# Patient Record
Sex: Female | Born: 1960 | Race: White | Hispanic: No | Marital: Married | Smoking: Never smoker
Health system: Southern US, Community
[De-identification: ages and names within clinical notes are randomized; demographics above are authoritative.]

## PROBLEM LIST (undated history)

## (undated) DIAGNOSIS — I10 Essential (primary) hypertension: Secondary | ICD-10-CM

## (undated) DIAGNOSIS — M1990 Unspecified osteoarthritis, unspecified site: Secondary | ICD-10-CM

## (undated) DIAGNOSIS — E785 Hyperlipidemia, unspecified: Secondary | ICD-10-CM

## (undated) DIAGNOSIS — H269 Unspecified cataract: Secondary | ICD-10-CM

## (undated) DIAGNOSIS — T7840XA Allergy, unspecified, initial encounter: Secondary | ICD-10-CM

## (undated) HISTORY — PX: WISDOM TOOTH EXTRACTION: SHX21

## (undated) HISTORY — PX: SCAR REVISION: SHX5285

## (undated) HISTORY — DX: Unspecified cataract: H26.9

## (undated) HISTORY — PX: LIPOMA EXCISION: SHX5283

## (undated) HISTORY — DX: Unspecified osteoarthritis, unspecified site: M19.90

## (undated) HISTORY — PX: ABDOMINAL HYSTERECTOMY: SHX81

## (undated) HISTORY — DX: Allergy, unspecified, initial encounter: T78.40XA

## (undated) HISTORY — DX: Essential (primary) hypertension: I10

## (undated) HISTORY — DX: Hyperlipidemia, unspecified: E78.5

## (undated) HISTORY — PX: OTHER SURGICAL HISTORY: SHX169

---

## 2000-03-23 ENCOUNTER — Inpatient Hospital Stay (HOSPITAL_COMMUNITY): Admission: RE | Admit: 2000-03-23 | Discharge: 2000-03-24 | Payer: Self-pay | Admitting: Obstetrics and Gynecology

## 2000-03-23 ENCOUNTER — Encounter (INDEPENDENT_AMBULATORY_CARE_PROVIDER_SITE_OTHER): Payer: Self-pay | Admitting: Specialist

## 2004-05-16 HISTORY — PX: TRIGGER FINGER RELEASE: SHX641

## 2006-05-16 HISTORY — PX: CARPAL TUNNEL RELEASE: SHX101

## 2008-07-31 ENCOUNTER — Ambulatory Visit (HOSPITAL_BASED_OUTPATIENT_CLINIC_OR_DEPARTMENT_OTHER): Admission: RE | Admit: 2008-07-31 | Discharge: 2008-07-31 | Payer: Self-pay | Admitting: Orthopedic Surgery

## 2009-12-09 ENCOUNTER — Encounter (INDEPENDENT_AMBULATORY_CARE_PROVIDER_SITE_OTHER): Payer: Self-pay | Admitting: *Deleted

## 2010-01-06 ENCOUNTER — Encounter (INDEPENDENT_AMBULATORY_CARE_PROVIDER_SITE_OTHER): Payer: Self-pay | Admitting: *Deleted

## 2010-01-11 ENCOUNTER — Ambulatory Visit: Payer: Self-pay | Admitting: Gastroenterology

## 2010-01-29 ENCOUNTER — Ambulatory Visit: Payer: Self-pay | Admitting: Gastroenterology

## 2010-06-15 NOTE — Letter (Signed)
Summary: Previsit letter  [facility]  [address] [place], [place] [address]   Phone: [phone]  Fax: [phone]       12/09/2009 MRN: [id number]  [facility] [address] [place], [place]  [address]  Dear Ms. Derflinger,  Welcome to the Gastroenterology Division at [facility].    You are scheduled to see a nurse for your pre-procedure visit on 01-11-10 at 8:00a.m. on the 3rd floor at [facility], [address]  We ask that you try to arrive at our office 15 minutes prior to your appointment time to allow for check-in.  Your nurse visit will consist of discussing your medical and surgical history, your immediate family medical history, and your medications.    Please bring a complete list of all your medications or, if you prefer, bring the medication bottles and we will list them.  We will need to be aware of both prescribed and over the counter drugs.  We will need to know exact dosage information as well.  If you are on blood thinners (Coumadin, Plavix, Aggrenox, Ticlid, etc.) please call our office today/prior to your appointment, as we need to consult with your physician about holding your medication.   Please be prepared to read and sign documents such as consent forms, a financial agreement, and acknowledgement forms.  If necessary, and with your consent, a friend or relative is welcome to sit-in on the nurse visit with you.  Please bring your insurance card so that we may make a copy of it.  If your insurance requires a referral to see a specialist, please bring your referral form from your primary care physician.  No co-pay is required for this nurse visit.     If you cannot keep your appointment, please call [phone] to cancel or reschedule prior to your appointment date.  This allows [place] the opportunity to schedule an appointment for another patient in need of care.    Thank you for choosing [facility] for your medical  needs.  We appreciate the opportunity to care for you.  Please visit [place] at our website  to learn more about our practice.                     Sincerely.                                                                                                                   The Gastroenterology Division

## 2010-06-15 NOTE — Miscellaneous (Signed)
Summary: [facility] Previsit/prep  Clinical Lists Changes  Medications: Added new medication of MOVIPREP 100 GM  SOLR (PEG-KCL-NACL-NASULF-NA ASC-C) As per prep instructions. - Signed Rx of MOVIPREP 100 GM  SOLR (PEG-KCL-NACL-NASULF-NA ASC-C) As per prep instructions.;  #1 x 0;  Signed;  Entered by: Wyona Almas RN;  Authorized by: Louis Meckel MD;  Method used: Electronically to [facility]  [address] [phone]*, [address] [place], [place], [place]  [address], Ph: [phone] or [phone], Fax: [phone] Observations: Added new observation of NKA: T (01/11/2010 7:52)    Prescriptions: MOVIPREP 100 GM  SOLR (PEG-KCL-NACL-NASULF-NA ASC-C) As per prep instructions.  #1 x 0   Entered by:   Wyona Almas RN   Authorized by:   Louis Meckel MD   Signed by:   Wyona Almas RN on 01/11/2010   Method used:   Electronically to        [facility]  [address] [phone]* (retail)       [address]       [place], [place]  [address]       Ph: [phone] or [phone]       Fax: [phone]   RxID:   [id number]

## 2010-06-15 NOTE — Letter (Signed)
Summary: [facility] Instructions  [facility]  [address] [place], [place] [address]   Phone: [phone]  Fax: [phone]       Veronica Zimmerman    [birth year]    MRN: [id number]        Procedure Day Dorna Bloom:  Farrell Ours  01/29/10     Arrival Time:  10:30AM     Procedure Time:  11:30AM     Location of Procedure:                    _ X_  [facility] (4th Floor)  PREPARATION FOR COLONOSCOPY WITH MOVIPREP   Starting 5 days prior to your procedure 01/24/10 do not eat nuts, seeds, popcorn, corn, beans, peas,  salads, or any raw vegetables.  Do not take any fiber supplements (e.g. Metamucil, Citrucel, and Benefiber).  THE DAY BEFORE YOUR PROCEDURE         DATE: 01/28/10  DAY: THURSDAY  1.  Drink clear liquids the entire day-NO SOLID FOOD  2.  Do not drink anything colored red or purple.  Avoid juices with pulp.  No orange juice.  3.  Drink at least 64 oz. (8 glasses) of fluid/clear liquids during the day to prevent dehydration and help the prep work efficiently.  CLEAR LIQUIDS INCLUDE: Water Jello Ice Popsicles Tea (sugar ok, no milk/cream) Powdered fruit flavored drinks Coffee (sugar ok, no milk/cream) Gatorade Juice: apple, white grape, white cranberry  Lemonade Clear bullion, consomm, broth Carbonated beverages (any kind) Strained chicken noodle soup Hard Candy                             4.  In the morning, mix first dose of MoviPrep solution:    Empty 1 Pouch A and 1 Pouch B into the disposable container    Add lukewarm drinking water to the top line of the container. Mix to dissolve    Refrigerate (mixed solution should be used within 24 hrs)  5.  Begin drinking the prep at 5:00 p.m. The MoviPrep container is divided by 4 marks.   Every 15 minutes drink the solution down to the next mark (approximately 8 oz) until the full liter is complete.   6.  Follow completed prep with 16 oz of clear liquid of your choice (Nothing red or purple).   Continue to drink clear liquids until bedtime.  7.  Before going to bed, mix second dose of MoviPrep solution:    Empty 1 Pouch A and 1 Pouch B into the disposable container    Add lukewarm drinking water to the top line of the container. Mix to dissolve    Refrigerate  THE DAY OF YOUR PROCEDURE      DATE: 01/29/10   DAY: FRIDAY  Beginning at 6:30AM (5 hours before procedure):         1. Every 15 minutes, drink the solution down to the next mark (approx 8 oz) until the full liter is complete.  2. Follow completed prep with 16 oz. of clear liquid of your choice.    3. You may drink clear liquids until 9:30AM (2 HOURS BEFORE PROCEDURE).   MEDICATION INSTRUCTIONS  Unless otherwise instructed, you should take regular prescription medications with a small sip of water   as early as possible the morning of your procedure.         OTHER INSTRUCTIONS  You will need a responsible adult at  least 52 years of age to accompany you and drive you home.   This person must remain in the waiting room during your procedure.  Wear loose fitting clothing that is easily removed.  Leave jewelry and other valuables at home.  However, you may wish to bring a book to read or  an iPod/MP3 player to listen to music as you wait for your procedure to start.  Remove all body piercing jewelry and leave at home.  Total time from sign-in until discharge is approximately 2-3 hours.  You should go home directly after your procedure and rest.  You can resume normal activities the  day after your procedure.  The day of your procedure you should not:   Drive   Make legal decisions   Operate machinery   Drink alcohol   Return to work  You will receive specific instructions about eating, activities and medications before you leave.    The above instructions have been reviewed and explained to me by   Wyona Almas RN  January 11, 2010 8:17 AM     I fully understand and can verbalize these  instructions _____________________________ Date _________

## 2010-06-15 NOTE — Procedures (Signed)
Summary: Colonoscopy  Patient: Veronica Zimmerman Note: All result statuses are Final unless otherwise noted.  Tests: (1) Colonoscopy (COL)   COL Colonoscopy           DONE     [facility]     [address]     [place], [place]  [address]           COLONOSCOPY PROCEDURE REPORT           PATIENT:  Veronica Zimmerman, Veronica Zimmerman  MR#:  [id number]     BIRTHDATE:  08/26/1960, 49 yrs. old  GENDER:  female           ENDOSCOPIST:  Barbette Hair. Arlyce Dice, MD     Referred by:           PROCEDURE DATE:  01/29/2010     PROCEDURE:  Diagnostic Colonoscopy     ASA CLASS:  Class I     INDICATIONS:  1) Routine Risk Screening           MEDICATIONS:   Fentanyl 75 mcg IV, Versed 9 mg IV           DESCRIPTION OF PROCEDURE:   After the risks benefits and     alternatives of the procedure were thoroughly explained, informed     consent was obtained.  Digital rectal exam was performed and     revealed no abnormalities.   The LB CF-[id number] endoscope     was introduced through the anus and advanced to the cecum, which     was identified by both the appendix and ileocecal valve, without     limitations.  The quality of the prep was excellent, using     MoviPrep.  The instrument was then slowly withdrawn as the colon     was fully examined.     <<PROCEDUREIMAGES>>           FINDINGS:  A normal appearing cecum, ileocecal valve, and     appendiceal orifice were identified. The ascending, hepatic     flexure, transverse, splenic flexure, descending, sigmoid colon,     and rectum appeared unremarkable (see image2, image3, image4,     image5, image6, image7, image10, image13, image16, image18, and     image19).   Retroflexed views in the rectum revealed no     abnormalities.    The time to cecum =  4.0  minutes. The scope was     then withdrawn (time =  6.0  min) from the patient and the     procedure completed.           COMPLICATIONS:  None           ENDOSCOPIC IMPRESSION:     1) Normal colon  RECOMMENDATIONS:     1) Continue current colorectal screening recommendations for     "routine risk" patients with a repeat colonoscopy in 10 years.           REPEAT EXAM:  In 10 year(s) for Colonoscopy.           ______________________________     Barbette Hair. Arlyce Dice, MD           CC: Markham Jordan MD           n.     Rosalie DoctorBarbette Hair. Kaplan at 01/29/2010 12:19 PM           Madolyn Frieze, [id number]  Note: An exclamation mark (!) indicates a result that was not dispersed  into the flowsheet. Document Creation Date: 01/29/2010 12:19 PM _______________________________________________________________________  (1) Order result status: Final Collection or observation date-time: 01/29/2010 12:13 Requested date-time:  Receipt date-time:  Reported date-time:  Referring Physician:   Ordering Physician: Melvia Heaps [phone]) Specimen Source:  Source: Launa Grill Order Number: [id number] Lab site:   Appended Document: Colonoscopy    Clinical Lists Changes  Observations: Added new observation of COLONNXTDUE: 01/2020 (01/29/2010 15:07)

## 2010-09-28 NOTE — Op Note (Signed)
Veronica Zimmerman, SHAREEF                ACCOUNT NO.:  [id number]   MEDICAL RECORD NO.:  [id number]          PATIENT TYPE:  AMB   LOCATION:  DSC                          FACILITY:  [facility]   PHYSICIAN:  Rodney A. 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  This has been treated with anti-  inflammatory drugs and injections which only gave her temporary relief.  She now wished to have a surgical release of her thumb.  Questions were  answered and encouraged preoperatively.  Complications were discussed in  the office and in the preop area prior to surgery.   JUSTIFICATION FOR OUTPATIENT SURGERY:  Minimal morbidity.   PREOPERATIVE DIAGNOSIS:  Right trigger thumb.   POSTOPERATIVE DIAGNOSIS:  Right trigger thumb.   OPERATION:  Release of A1 pulley, flexor pollicis longus tendon sheath,  right thumb.   SURGEON:  Lenard Galloway. Chaney Malling, MD   ANESTHESIA:  MAC.   PROCEDURE IN DETAIL:  The patient was placed on the operating table in  supine position with pneumatic tourniquet applied to the right upper  arm.  The entire right upper extremity was prepped with DuraPrep and  draped out in the usual manner.  Local Xylocaine was used to do a  digital nerve block.  The hand was then wrapped out with an Esmarch and  tourniquet was elevated.  Loupe magnification was used throughout.  A  Brunner Z type incision made on the flexor surface of the MP joint to  the right thumb.  Skin edges were retracted.  Bleeders were coagulated.  A digital nerve was identified nicely and small right angle retractors  were put in place to protect the nerves.  The flexor sheath was seen to  be markedly thickened.  There were some cortisone deposits around the  sheath.  The sheath was incised and a portion of sheath was then  excised  to allow full mobilization of the FPL.  The sheath was then split  proximally and distally and very careful examination of the FPL was  done.  There was free range of motion about the FPL.  Under direct  vision, no strictures or other abnormalities.  Skin edges were then  closed with 4-0 nylon sutures.  Sterile dressing was applied and the  patient returned to the recovery room in excellent condition.  Technically, this procedure went extremely well.   DISPOSITION:  1. Norco for pain.  2. Usual postop instructions.  3. Return to my office on Wednesday.  4. She may remove the bandage in 3-4 days and put on a light bandage      and use the thumb as needed.      Rodney A. Chaney Malling, M.D.  Electronically Signed     RAM/MEDQ  D:  07/31/2008  T:  07/31/2008  Job:  [id number]

## 2010-10-01 NOTE — Op Note (Signed)
[facility]  Patient:    Veronica Zimmerman, Veronica Zimmerman [facility]                MRN: [id number] Proc. Date: 03/23/00 Adm. Date:  [id number] Attending:  Lendon Colonel                           Operative Report  PREOPERATIVE DIAGNOSIS:  Persistent menorrhagia and uterine enlargement, status post two cesarean sections.  POSTOPERATIVE DIAGNOSIS:  Persistent menorrhagia and uterine enlargement, status post two cesarean sections.  OPERATION:  Vaginal hysterectomy.  SURGEON:  Katherine Roan, M.D.  DESCRIPTION OF PROCEDURE:  The patient was placed in the lithotomy position, prepped and draped in the usual fashion.  The bladder was emptied.  The cervix was grasped with a tenaculum and injected with 10 cc of 1% Xylocaine with epinephrine.  Following this, the posterior cul-de-sac was incised with sharp dissection.  Uterosacrals and cardinals were then clamped and ligated with 0 chromic suture.  The vesicouterine peritoneum was dissected anteriorly.  The bladder flap was quite high from the previous C-section, so one other clamp on each side of the uterus to obtain uterine artery ligation was accomplished. The vesicouterine peritoneum was then identified and entered with sharp dissection.  The remaining portion of the upper broad ligament was then clamped and ligated with 0 chromic x 2 on each side.  The uterus was fairly enlarged, so we were very careful to obtain small bites after some clamps were utilized.  I then flipped the uterus posteriorly, but it was too big that I could not remove it, so we flipped it anteriorly, and were able to remove the uterus by clamping across utero-ovarian pedicle and the upper broad ligament. Both ovaries were normal.  Hemostasis was secured.  There was a oozer in the pedicle of the right ovary and this was arrested with an 0 Vicryl suture. Uterosacral ligaments were then carefully plicated in the midline with 0 Vicryl, and the vagina  was closed in a vertical plane, first from the uterosacral down inferiorly to using figure-of-eight of 0 Vicryl.  Then, the pelvic peritoneum was pursestringed, and the remaining vagina was closed in a vertical plane with a locking suture of 0 PDS.  Hemostasis was quite secure. The bladder was drained of about 150 cc of clear urine.  The patient tolerated this procedure and was sent to the recovery room in good condition. DD:  03/23/00 TD:  03/23/00 Job: [id number]

## 2010-10-01 NOTE — Discharge Summary (Signed)
[facility]  Patient:    Veronica Zimmerman, Veronica Zimmerman [facility]                MRN: [id number] Adm. Date:  [id number] Disc. Date: [id number] Attending:  Lendon Colonel                           Discharge Summary  ADMITTING DIAGNOSIS:  Continued menorrhagia, uterine enlargement.  DISCHARGE DIAGNOSIS:  Continued menorrhagia, uterine enlargement.  OPERATION PERFORMED:  Vaginal hysterectomy.  HISTORY OF PRESENT ILLNESS:  Ms. Dougher is a 50 year old female status post two C sections who presents for a vaginal hysterectomy for continued heavy bleeding.  She had had conservative attempts with hysteroscopy and laparoscopy to no avail.  Oral contraceptives were not an item for her because she was intolerant.  LABORATORY STUDIES:  Hemoglobin on admission was 12.5 and an MCV was 88. Coagulation profile was normal as was cardiogram.  HOSPITAL COURSE:  Patient was admitted to the hospital and underwent an uneventful vaginal hysterectomy.  The following day, she was discharged to home and office care.  She was asked to call for bleeding, or fever, or any pain that she may have.  Final pathology report revealed the uterus to weight 178 g.  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  CONDITION ON DISCHARGE:  Improved. DD:  04/05/00 TD:  04/07/00 Job: [id number]

## 2010-10-01 NOTE — H&P (Signed)
[facility]  Patient:    Veronica Zimmerman, Veronica Zimmerman                         MRN: [id number] Adm. Date:  03/22/00 Attending:  Katherine Roan, M.D.                         History and Physical  CHIEF COMPLAINT:  Continued heavy periods.  HISTORY OF PRESENT ILLNESS:  Veronica Zimmerman is a 50 year old gravida 2, para 2 female, status post two C-sections in 1984 and 1986 who presents for a hysterectomy for continued heavy bleeding.  She has had conservative attempts with hysteroscopy and laparoscopy to no avail.  She cannot take oral contraceptives because she is intolerant to the oral contraceptives.  PAST SURGICAL PROCEDURES:  Included wisdom teeth in 1984.  She has a scar vision on her face in 1986, laparoscopy, hysteroscopy in 1999, and she is status post C-sections.  REVIEW OF SYSTEMS:  HEENT:  She wears glasses, but no dizziness.  Decreasing visual and auditory acuity, no headaches.  HEART:  No history of rheumatic fever, no history of mitral valve prolapse, no hypertension.  LUNGS:  No chronic cough or asthma.  No hemoptysis.  GENITOURINARY:  No stress urinary incontinence, no frequency, no history of recurrent UTIs.  GASTROINTESTINAL: No bowel habit change, no indigestion, no weight loss or anorexia.  No melena. MUSCLES, BONES, AND JOINTS:  No fractures or arthritis.  SOCIAL HISTORY:  She works at day care.  FAMILY HISTORY:  Mother living at 42, had a history of uterine cancer.  Father died at age 70 from cancer.  She has one brother who has had Hodgkins and is living and well.  She has two sisters who are living and well.  PHYSICAL EXAMINATION:  GENERAL:  Well-developed, well-nourished female, no acute distress.  She appears consistent with stated age.  VITAL SIGNS:  Weight 184 pounds, blood pressure 120/70.  HEENT:  Unremarkable.  Oropharynx is not injected.  NECK:  Supple.  Carotid pulses are without bruits.  Thyroid is not enlarged. Trachea is in the  midline.  BREASTS:  No masses or tenderness.  LUNGS:  Clear to P&A.  Diaphragms move well with inspiration and expiration.  HEART:  Normal sinus rhythm.  No heaves, thrills, rubs, or gallops.  ABDOMEN:  Soft on plain.  No mass is felt.  Bowel sounds are normal.  Liver, spleen, or kidneys are not enlarged.  Femoral pulses are equal bilaterally without bruits.  PELVIC:  Normal vulva and vagina.  The cervix is well supported.  Uterus is normal size and shape, maybe slightly enlarged.  Adnexae negative. Rectovaginal confirms.  EXTREMITIES:  Show good range of motion, equal pulses and reflexes.  IMPRESSION:  Menorrhagia, persistent despite a conservative attempt.  PLAN:  _________ hysterectomy, possible abdominal hysterectomy.  Risks and benefits detailed and informed consent have been given to the patient. DD:  03/22/00 TD:  03/22/00 Job: [id number]

## 2013-05-30 ENCOUNTER — Ambulatory Visit (INDEPENDENT_AMBULATORY_CARE_PROVIDER_SITE_OTHER): Payer: 59

## 2013-05-30 ENCOUNTER — Ambulatory Visit (INDEPENDENT_AMBULATORY_CARE_PROVIDER_SITE_OTHER): Payer: 59 | Admitting: Podiatry

## 2013-05-30 ENCOUNTER — Encounter: Payer: Self-pay | Admitting: Podiatry

## 2013-05-30 VITALS — BP 131/76 | HR 67 | Resp 12

## 2013-05-30 DIAGNOSIS — R52 Pain, unspecified: Secondary | ICD-10-CM

## 2013-05-30 DIAGNOSIS — M629 Disorder of muscle, unspecified: Secondary | ICD-10-CM

## 2013-05-30 DIAGNOSIS — M2420 Disorder of ligament, unspecified site: Secondary | ICD-10-CM

## 2013-05-30 MED ORDER — MELOXICAM 15 MG PO TABS: 15.0000 mg | ORAL_TABLET | Freq: Every day | ORAL | Status: DC

## 2013-06-01 NOTE — Progress Notes (Signed)
She states that she was bowling the other night and heard and felt a tear and her right foot. She states since that point her plantar fasciitis seems to be resolving to some degree.  Objective: Vital signs are stable she is alert and oriented x3. There is no erythema edema cellulitis drainage or odor to the right foot. She says have some tenderness on palpation just distal to the insertion site of the plantar fascia of the right foot. Radiographic evaluation does demonstrate an interruption of the plantar fascial just distal to the insertion site. But no osseous abnormalities.  Assessment: Probable rupture of the plantar fascial. Right foot  Plan: Discussed etiology pathology conservative versus surgical therapies at this point I dispensed a Cam Walker and a night splint encourage should wear these for 4-6 weeks and continue ice therapy followup with her on an as-needed basis

## 2013-06-27 ENCOUNTER — Encounter: Payer: Self-pay | Admitting: Podiatry

## 2013-06-27 ENCOUNTER — Ambulatory Visit (INDEPENDENT_AMBULATORY_CARE_PROVIDER_SITE_OTHER): Payer: 59 | Admitting: Podiatry

## 2013-06-27 VITALS — BP 130/71 | HR 71 | Resp 16 | Ht 63.0 in | Wt 215.0 lb

## 2013-06-27 DIAGNOSIS — M629 Disorder of muscle, unspecified: Secondary | ICD-10-CM

## 2013-06-27 NOTE — Progress Notes (Signed)
She presents today wearing her Cam Walker to her right heel. She states it is still tiny bit of pain from this plantar fascial tear.  Objective: Vital signs are stable she is alert and oriented x3. She has minimal tenderness on palpation with deficit of the plantar fascia just distal to the calcaneal insertion site of the right heel. It appears to be the medial band.  Assessment: Plantar fascial tear right foot.  Plan: Discussed etiology pathology conservative versus surgical therapies. At this point I decided not to inject the area getting a time to heal 100%. I encouraged her to discontinue the use of the Cam Walker her regular basis should she need it on occasion that would be fine however I want her to wear a good structured tennis shoe. She will followup with me in one month at which time if she is no better we will then injected with cortisone.

## 2013-07-25 ENCOUNTER — Ambulatory Visit (INDEPENDENT_AMBULATORY_CARE_PROVIDER_SITE_OTHER): Payer: 59 | Admitting: Podiatry

## 2013-07-25 VITALS — BP 139/73 | HR 78 | Resp 16 | Ht 63.0 in | Wt 225.0 lb

## 2013-07-25 DIAGNOSIS — R52 Pain, unspecified: Secondary | ICD-10-CM

## 2013-07-25 DIAGNOSIS — M722 Plantar fascial fibromatosis: Secondary | ICD-10-CM

## 2013-07-26 NOTE — Progress Notes (Signed)
She presents today complaining of pain to the right heel once again. She continues conservative therapies that time however she takes her nonsteroidal anti-inflammatory drug on an as-needed basis.  Objective: Vital signs are stable she is alert and oriented x3 she has pain on palpation medial continued tubercle of the right heel.  Assessment: Plantar fasciitis right.  Plan injected the right heel today encouraged her to continue the use of the night splint as well as her inserts as well as her oral anti-inflammatories. We'll followup with her in one month if needed.

## 2013-08-08 ENCOUNTER — Ambulatory Visit: Payer: 59 | Admitting: Podiatry

## 2014-02-05 ENCOUNTER — Ambulatory Visit (INDEPENDENT_AMBULATORY_CARE_PROVIDER_SITE_OTHER): Payer: 59 | Admitting: Podiatry

## 2014-02-05 VITALS — BP 130/82 | HR 62 | Resp 16

## 2014-02-05 DIAGNOSIS — L608 Other nail disorders: Secondary | ICD-10-CM

## 2014-02-05 NOTE — Progress Notes (Signed)
She presents today with a chief complaint of discolored toenails one through 5 bilaterally.  Objective: Vital signs are stable she is alert and oriented x3 pulses are palpable bilateral. Nails are thick yellow dystrophic have a greenish gray color change to them.  Assessment: Nail dystrophy 1 through 5 bilateral.  Plan: Nail samples were taken from the hallux nails bilaterally today and sent for pathologic evaluation. We will notify her once the cement.

## 2014-02-17 ENCOUNTER — Encounter: Payer: Self-pay | Admitting: Podiatry

## 2014-02-19 ENCOUNTER — Encounter: Payer: Self-pay | Admitting: Podiatry

## 2014-03-03 ENCOUNTER — Ambulatory Visit (INDEPENDENT_AMBULATORY_CARE_PROVIDER_SITE_OTHER): Payer: 59 | Admitting: Podiatry

## 2014-03-03 VITALS — BP 122/69 | HR 61 | Resp 16

## 2014-03-03 DIAGNOSIS — Z79899 Other long term (current) drug therapy: Secondary | ICD-10-CM

## 2014-03-03 LAB — CBC WITH DIFFERENTIAL/PLATELET
Basophils Absolute: 0.1 10*3/uL (ref 0.0–0.2)
Basos: 1 %
EOS: 3 %
Eosinophils Absolute: 0.2 10*3/uL (ref 0.0–0.4)
HEMATOCRIT: 39.5 % (ref 34.0–46.6)
HEMOGLOBIN: 12.9 g/dL (ref 11.1–15.9)
IMMATURE GRANULOCYTES: 0 %
Immature Grans (Abs): 0 10*3/uL (ref 0.0–0.1)
LYMPHS ABS: 1.8 10*3/uL (ref 0.7–3.1)
Lymphs: 28 %
MCH: 29.9 pg (ref 26.6–33.0)
MCHC: 32.7 g/dL (ref 31.5–35.7)
MCV: 92 fL (ref 79–97)
MONOCYTES: 8 %
Monocytes Absolute: 0.5 10*3/uL (ref 0.1–0.9)
NEUTROS ABS: 3.8 10*3/uL (ref 1.4–7.0)
Neutrophils Relative %: 60 %
RBC: 4.31 x10E6/uL (ref 3.77–5.28)
RDW: 13.3 % (ref 12.3–15.4)
WBC: 6.4 10*3/uL (ref 3.4–10.8)

## 2014-03-03 MED ORDER — TERBINAFINE HCL 250 MG PO TABS: 250.0000 mg | ORAL_TABLET | Freq: Every day | ORAL | Status: DC

## 2014-03-03 NOTE — Progress Notes (Signed)
She presents today for followup of her lab reports.  Objective: Pulses are strongly palpable. Lab reports did demonstrate saprophytic fungus.  Assessment: Onychomycosis.  Plan: Discussed etiology pathology conservative versus surgical therapies. Started her Lamisil therapy. She received a prescription for liver profile and CBC which will be drawn at her earliest convenience. I will followup with her should this return abnormal.

## 2014-03-04 ENCOUNTER — Telehealth: Payer: Self-pay | Admitting: *Deleted

## 2014-03-04 LAB — HEPATIC FUNCTION PANEL
ALT: 21 IU/L (ref 0–32)
AST: 21 IU/L (ref 0–40)
Albumin: 4.5 g/dL (ref 3.5–5.5)
Alkaline Phosphatase: 87 IU/L (ref 39–117)
Bilirubin, Direct: 0.05 mg/dL (ref 0.00–0.40)
Total Bilirubin: 0.3 mg/dL (ref 0.0–1.2)
Total Protein: 6.9 g/dL (ref 6.0–8.5)

## 2014-03-04 NOTE — Telephone Encounter (Signed)
Pt called stated she took lamisil last night with a graham cracker and she went to bed and her chest felt like it was fluttering. Wants to know if she needs to stop taking lamisil and if this is a serious side effect?

## 2014-03-04 NOTE — Telephone Encounter (Signed)
Tell her to try to take it with dinner.

## 2014-03-05 ENCOUNTER — Telehealth: Payer: Self-pay | Admitting: *Deleted

## 2014-03-05 NOTE — Telephone Encounter (Signed)
Spoke with pt letting her know per dr Al Corpushyatt try to take lamisil with dinner. Pt stated she took a pill last night and did not have any fluttering.

## 2014-03-05 NOTE — Telephone Encounter (Signed)
Spoke to patient regarding her blood work ok to continue medication

## 2014-03-05 NOTE — Telephone Encounter (Signed)
Message copied by Bing ReeGUNN, Jobany Montellano L on Wed Mar 05, 2014  2:20 PM ------      Message from: Ernestene KielHYATT, MAX T      Created: Tue Mar 04, 2014  7:18 AM       Blood work looks good.  Continue medication. ------

## 2014-03-11 ENCOUNTER — Telehealth: Payer: Self-pay | Admitting: *Deleted

## 2014-03-11 NOTE — Telephone Encounter (Signed)
Patient states that since she started lamisil she has had some aches and pain in her back from middle of shoulders down her spine into her legs,  Can this be caused by the lamisil?

## 2014-03-11 NOTE — Telephone Encounter (Signed)
Lamisil is not known to cause muscle aches and pains.  I would continue medication.

## 2014-03-31 ENCOUNTER — Ambulatory Visit (INDEPENDENT_AMBULATORY_CARE_PROVIDER_SITE_OTHER): Payer: 59 | Admitting: Podiatry

## 2014-03-31 VITALS — BP 125/65 | HR 57 | Resp 16

## 2014-03-31 DIAGNOSIS — Z79899 Other long term (current) drug therapy: Secondary | ICD-10-CM

## 2014-03-31 LAB — CBC WITH DIFFERENTIAL/PLATELET
BASOS: 1 %
Basophils Absolute: 0.1 10*3/uL (ref 0.0–0.2)
Eos: 2 %
Eosinophils Absolute: 0.2 10*3/uL (ref 0.0–0.4)
HCT: 40.5 % (ref 34.0–46.6)
Hemoglobin: 13.3 g/dL (ref 11.1–15.9)
IMMATURE GRANS (ABS): 0 10*3/uL (ref 0.0–0.1)
Immature Granulocytes: 0 %
LYMPHS: 21 %
Lymphocytes Absolute: 1.7 10*3/uL (ref 0.7–3.1)
MCH: 30 pg (ref 26.6–33.0)
MCHC: 32.8 g/dL (ref 31.5–35.7)
MCV: 91 fL (ref 79–97)
Monocytes Absolute: 0.6 10*3/uL (ref 0.1–0.9)
Monocytes: 7 %
Neutrophils Absolute: 5.6 10*3/uL (ref 1.4–7.0)
Neutrophils Relative %: 69 %
RBC: 4.44 x10E6/uL (ref 3.77–5.28)
RDW: 13.2 % (ref 12.3–15.4)
WBC: 8.2 10*3/uL (ref 3.4–10.8)

## 2014-03-31 MED ORDER — TERBINAFINE HCL 250 MG PO TABS: 250.0000 mg | ORAL_TABLET | Freq: Every day | ORAL | Status: DC

## 2014-03-31 NOTE — Progress Notes (Signed)
She presents today denying complications with the use of Lamisil.  Objective: Nails appear to be more clear and the fungus appears to be growing out.  Assessment: Onychomycosis long-term therapy with use of Lamisil.  Plan: Continue use of Lamisil for another 90 days when prescription for CBC and liver profile. I will follow-up with her should this come back abnormal. Otherwise I will see her in 4 months area

## 2014-04-01 LAB — HEPATIC FUNCTION PANEL
ALT: 25 IU/L (ref 0–32)
AST: 23 IU/L (ref 0–40)
Albumin: 4.8 g/dL (ref 3.5–5.5)
Alkaline Phosphatase: 105 IU/L (ref 39–117)
BILIRUBIN DIRECT: 0.06 mg/dL (ref 0.00–0.40)
TOTAL PROTEIN: 7.2 g/dL (ref 6.0–8.5)
Total Bilirubin: 0.2 mg/dL (ref 0.0–1.2)

## 2014-04-02 ENCOUNTER — Telehealth: Payer: Self-pay | Admitting: *Deleted

## 2014-04-02 NOTE — Telephone Encounter (Signed)
-----   Message from Elinor ParkinsonMax T Hyatt, [place] Dakota[place] sent at 04/01/2014  7:50 AM EST ----- Blood work looks good and may continue with current medication.

## 2014-04-02 NOTE — Telephone Encounter (Signed)
Blood work ok continue with medication . Spoke to patient

## 2014-05-06 ENCOUNTER — Other Ambulatory Visit: Payer: Self-pay | Admitting: *Deleted

## 2014-05-06 MED ORDER — MELOXICAM 15 MG PO TABS: 15.0000 mg | ORAL_TABLET | Freq: Every day | ORAL | Status: DC

## 2014-05-06 NOTE — Telephone Encounter (Signed)
Fax request from [facility] [place] for mobic 15 mg. Per dr Al Corpushyatt refill mobic 15 mg #30 with 3 refills. One by mouth daily.

## 2014-05-07 ENCOUNTER — Other Ambulatory Visit: Payer: Self-pay | Admitting: *Deleted

## 2014-05-07 MED ORDER — MELOXICAM 15 MG PO TABS: 15.0000 mg | ORAL_TABLET | Freq: Every day | ORAL | Status: DC

## 2014-07-30 ENCOUNTER — Ambulatory Visit (INDEPENDENT_AMBULATORY_CARE_PROVIDER_SITE_OTHER): Payer: Commercial Managed Care - PPO | Admitting: Podiatry

## 2014-07-30 VITALS — BP 122/68 | HR 61 | Resp 16

## 2014-07-30 DIAGNOSIS — Z79899 Other long term (current) drug therapy: Secondary | ICD-10-CM

## 2014-07-30 NOTE — Progress Notes (Signed)
She presents today for follow-up of onychomycosis of the hallux nails bilateral. She states that they do not seem to be getting better.  Objective: Vital signs are stable she is alert and oriented 3. She has completed 120 days of Lamisil therapy. At this point her nails are unchanged hallux bilateral.  Assessment: Mycosis hallux bilateral.  Plan: Suggested we start laser therapy to nail plates hallux bilateral.

## 2014-08-05 ENCOUNTER — Ambulatory Visit: Payer: Commercial Managed Care - PPO | Admitting: Podiatry

## 2014-08-05 ENCOUNTER — Ambulatory Visit: Payer: Commercial Managed Care - PPO

## 2014-08-05 ENCOUNTER — Ambulatory Visit (INDEPENDENT_AMBULATORY_CARE_PROVIDER_SITE_OTHER): Payer: Commercial Managed Care - PPO | Admitting: Podiatry

## 2014-08-05 DIAGNOSIS — B351 Tinea unguium: Secondary | ICD-10-CM

## 2014-08-05 NOTE — Progress Notes (Signed)
She presented today for laser therapy of onychomycosis. She tolerated the procedure well and will follow up with us in 3 months for another laser therapy.

## 2014-11-11 ENCOUNTER — Ambulatory Visit (INDEPENDENT_AMBULATORY_CARE_PROVIDER_SITE_OTHER): Payer: Commercial Managed Care - PPO | Admitting: Podiatry

## 2014-11-11 ENCOUNTER — Encounter: Payer: Self-pay | Admitting: Podiatry

## 2014-11-11 DIAGNOSIS — B351 Tinea unguium: Secondary | ICD-10-CM

## 2014-11-11 NOTE — Progress Notes (Signed)
She presented today for laser therapy for onychomycosis to hallux bilateral. She tolerated the procedure well and will follow up with us in 3 months for another laser therapy if necessary.

## 2015-02-12 ENCOUNTER — Encounter: Payer: Self-pay | Admitting: Podiatry

## 2015-02-12 ENCOUNTER — Ambulatory Visit: Payer: Commercial Managed Care - PPO | Admitting: Podiatry

## 2015-02-12 DIAGNOSIS — B351 Tinea unguium: Secondary | ICD-10-CM

## 2015-02-13 NOTE — Progress Notes (Signed)
She presents today for follow-up of her onychomycosis and laser therapy. She states that after the second treatment she noticed that it was seen to be doing much better.  Objective: Hallux nail plates bilaterally demonstrating clearing of the onychomycosis.  Assessment: Onychomycosis bilateral.  Plan: Third laser therapy hallux bilateral. Follow-up with me in 2-3 months. Consider another laser therapy at that time.

## 2015-04-14 ENCOUNTER — Ambulatory Visit (INDEPENDENT_AMBULATORY_CARE_PROVIDER_SITE_OTHER): Payer: 59 | Admitting: Podiatry

## 2015-04-14 ENCOUNTER — Encounter: Payer: Self-pay | Admitting: Podiatry

## 2015-04-14 DIAGNOSIS — B351 Tinea unguium: Secondary | ICD-10-CM

## 2015-04-14 NOTE — Progress Notes (Signed)
She presents today to follow-up for onychomycosis after laser nail therapy. She states that it really seems to not have worked very well for these nails. She states that initially it seemed to be doing great and then it subsided and worsened.  Objective: Vital signs are stable she is alert and oriented 3 pulses are palpable. Hallux nail plates bilateral do demonstrate onychocryptosis with nail dystrophy and worsening of discoloration which very well could be worsening of her onychomycosis.  Assessment: Intractable onychomycosis halluxbilateral.  Plan: I encouraged her to keep the nails cut short and filed thin as possible to help prevent them from rubbing which would worsen the nail dystrophy. Follow-up with me as needed.  Arbutus Pedodd Camy Leder DPM

## 2015-06-22 ENCOUNTER — Encounter: Payer: Self-pay | Admitting: Gastroenterology

## 2016-02-25 ENCOUNTER — Ambulatory Visit (INDEPENDENT_AMBULATORY_CARE_PROVIDER_SITE_OTHER): Payer: 59 | Admitting: Podiatry

## 2016-02-25 ENCOUNTER — Encounter: Payer: Self-pay | Admitting: Podiatry

## 2016-02-25 ENCOUNTER — Ambulatory Visit (INDEPENDENT_AMBULATORY_CARE_PROVIDER_SITE_OTHER): Payer: 59

## 2016-02-25 DIAGNOSIS — M79676 Pain in unspecified toe(s): Secondary | ICD-10-CM

## 2016-02-25 DIAGNOSIS — M722 Plantar fascial fibromatosis: Secondary | ICD-10-CM

## 2016-02-25 DIAGNOSIS — B351 Tinea unguium: Secondary | ICD-10-CM | POA: Diagnosis not present

## 2016-02-25 MED ORDER — DICLOFENAC SODIUM 75 MG PO TBEC: 75.0000 mg | DELAYED_RELEASE_TABLET | Freq: Two times a day (BID) | ORAL | 3 refills | Status: DC

## 2016-02-25 NOTE — Progress Notes (Signed)
She presents today with chief complaint of pain to the left heel times a past 6 weeks. Mornings are particularly bad. She states that she took the meloxicam until she ran out which rated fill tolerable.  Objective: Vital signs are stable alert and oriented 3. Pulses are palpable. Neurologic sensorium is intact. Deep tendon reflexes are intact. Muscle strength is 5 over 5 dorsiflexion plantar flexors and inverters and everters all just musculatures intact. Orthopedic evaluation was resulting assistant ankle full range of motion without crepitation. She has pain on palpation make a conjunctival of the left heel. Radiographs do demonstrate soft tissue increased density at the plantar fascia insertion site consisting of plantar fasciitis. No open lesions or wounds are noted.  Assessment: Plantar fasciitis left.  Plan: Injected the left heel today with Kenalog and local anesthetic Western plantar fascia brace and a night splint. Started her on a diclofenac 75 mg. Follow up with her in one month discussed appropriate shoe gear stretching exercises ice therapy and shoe modifications.

## 2016-02-25 NOTE — Patient Instructions (Signed)

## 2016-02-29 ENCOUNTER — Ambulatory Visit: Payer: 59 | Admitting: Podiatry

## 2016-03-03 ENCOUNTER — Telehealth: Payer: Self-pay | Admitting: *Deleted

## 2016-03-03 NOTE — Telephone Encounter (Signed)
Patient called states she was having GI upset from Diclofenac. Requesting meloxicam Rx since she has had that one before. Sent in Meloxicam Rx to [facility] [place]

## 2016-03-07 ENCOUNTER — Other Ambulatory Visit: Payer: Self-pay | Admitting: Podiatry

## 2016-03-07 ENCOUNTER — Telehealth: Payer: Self-pay | Admitting: Podiatry

## 2016-03-07 NOTE — Telephone Encounter (Signed)
PATIENT CALLED LAST Thursday ABOUT GETTING A NEW RX FOR MELOXICAM. PATIENT SAW DR. HYATT 2 WKS AGO AND SHE ADVISED THAT THE DICLOFENAC UPSET HER STOMACH AND SHE WAS NOT ABLE TO TAKE IT ASKED THAT WE CALL SOMETHING ELSE IN FOR HER AND HER PHARMACY [facility] [place] SAID THAT THEY HAVE NOT RECEIVED ANY NEW ORDERS FOR HER FROM DR. HYATT.

## 2016-03-08 MED ORDER — MELOXICAM 15 MG PO TABS: 15.0000 mg | ORAL_TABLET | Freq: Every day | ORAL | 0 refills | Status: DC

## 2016-03-08 NOTE — Addendum Note (Signed)
Addended by: Alphia Kava'CONNELL, VALERY D on: 03/08/2016 10:13 AM   Modules accepted: Orders

## 2016-03-08 NOTE — Telephone Encounter (Signed)
I reviewed pt's [place] Review and 03/03/2016 Mobic had been ordered but not escribed [id number]. I escribed to [id number], and informed pt of the order and apologized for the delay.

## 2016-03-28 ENCOUNTER — Encounter: Payer: Self-pay | Admitting: Podiatry

## 2016-03-28 ENCOUNTER — Ambulatory Visit (INDEPENDENT_AMBULATORY_CARE_PROVIDER_SITE_OTHER): Payer: 59 | Admitting: Podiatry

## 2016-03-28 DIAGNOSIS — M722 Plantar fascial fibromatosis: Secondary | ICD-10-CM

## 2016-03-29 NOTE — Progress Notes (Signed)
She presents today for follow-up of her plantar fasciitis and states that she has good days and bad days.  Objective: Pulses remain palpable pain mild pain on palpation medial calcaneal tubercle slowly resolving.  Assessment: Resolving plantar fasciitis left.  Plan: Reinjected the area today.

## 2016-04-04 ENCOUNTER — Other Ambulatory Visit: Payer: Self-pay | Admitting: Podiatry

## 2016-04-04 NOTE — Telephone Encounter (Signed)
Pt needs an appt prior to future refills. 

## 2016-05-02 ENCOUNTER — Ambulatory Visit (INDEPENDENT_AMBULATORY_CARE_PROVIDER_SITE_OTHER): Payer: 59 | Admitting: Podiatry

## 2016-05-02 DIAGNOSIS — B351 Tinea unguium: Secondary | ICD-10-CM | POA: Diagnosis not present

## 2016-05-02 DIAGNOSIS — M722 Plantar fascial fibromatosis: Secondary | ICD-10-CM | POA: Diagnosis not present

## 2016-05-02 NOTE — Progress Notes (Signed)
She presents today for follow-up of her plantar fasciitis for left foot states that her left foot was doing much better until December 2 she was trying to get a dog into the car and she started to feel pain as she was up on her forefoot. But it has resolved some degree she has not taken any medication for.  Objective: Pulses remain palpable left pain. She has pain on direct palpation of the left heel.  Assessment: Chronic intractable plantar fasciitis left foot.  Plan: I injected Kenalog and local anesthetic to the point of maximal tenderness today will follow up with her in 1 month if necessary.

## 2016-07-13 DIAGNOSIS — Z1231 Encounter for screening mammogram for malignant neoplasm of breast: Secondary | ICD-10-CM | POA: Diagnosis not present

## 2016-07-13 DIAGNOSIS — Z01419 Encounter for gynecological examination (general) (routine) without abnormal findings: Secondary | ICD-10-CM | POA: Diagnosis not present

## 2016-07-27 DIAGNOSIS — G5711 Meralgia paresthetica, right lower limb: Secondary | ICD-10-CM | POA: Diagnosis not present

## 2016-10-17 ENCOUNTER — Encounter (HOSPITAL_COMMUNITY): Payer: Self-pay | Admitting: Emergency Medicine

## 2016-10-17 ENCOUNTER — Emergency Department (HOSPITAL_COMMUNITY): Payer: 59

## 2016-10-17 ENCOUNTER — Emergency Department (HOSPITAL_COMMUNITY)
Admission: EM | Admit: 2016-10-17 | Discharge: 2016-10-17 | Disposition: A | Discharge status: Home / Self Care | Payer: 59 | Attending: Emergency Medicine | Admitting: Emergency Medicine

## 2016-10-17 DIAGNOSIS — Z79899 Other long term (current) drug therapy: Secondary | ICD-10-CM | POA: Diagnosis not present

## 2016-10-17 DIAGNOSIS — R042 Hemoptysis: Secondary | ICD-10-CM | POA: Insufficient documentation

## 2016-10-17 DIAGNOSIS — K920 Hematemesis: Secondary | ICD-10-CM | POA: Diagnosis present

## 2016-10-17 MED ORDER — IOPAMIDOL (ISOVUE-370) INJECTION 76%
INTRAVENOUS | Status: AC
  Administered 2016-10-17: 80 mL
  Filled 2016-10-17: qty 100

## 2016-10-17 NOTE — ED Provider Notes (Signed)
[facility]ENCY DEPT Provider Note   CSN: [id number] Arrival date & time: 10/17/16  1111     History   Chief Complaint Chief Complaint  Patient presents with  . Hematemesis    HPI Bobbiejo Jennings BooksMcMillan Limbach is a 56 y.o. female.Patient opened a can of chlorine for a swimming pool while outdoors this morning and breathed in fumes from the can of chlorine. She began to cough and coughed up blood as result of the event. Since the event she feels as if she "can't get a good deep breath." She denies vomiting or hematemesis. Otherwise asymptomatic without treatment.  HPI  Past Medical History:  Diagnosis Date  . Allergy     There are no active problems to display for this patient.   Past Surgical History:  Procedure Laterality Date  . ABDOMINAL HYSTERECTOMY      OB History    No data available       Home Medications    Prior to Admission medications   Medication Sig Start Date End Date Taking? Authorizing Provider  fluticasone (FLONASE) 50 MCG/ACT nasal spray Place 2 sprays into both nostrils at bedtime. 09/29/16  Yes [provider]  meloxicam (MOBIC) 15 MG tablet TAKE 1 TABLET (15 MG TOTAL) BY MOUTH DAILY. 04/04/16  Yes Hyatt, Max T, DPM  diclofenac (VOLTAREN) 75 MG EC tablet Take 1 tablet (75 mg total) by mouth 2 (two) times daily. Patient not taking: Reported on 10/17/2016 02/25/16   Elinor ParkinsonHyatt, Max T, DPM    Family History No family history on file.  Social History Social History  Substance Use Topics  . Smoking status: Never Smoker  . Smokeless tobacco: Not on file  . Alcohol use No     Allergies   Patient has no known allergies.   Review of Systems Review of Systems  Constitutional: Negative.   HENT: Negative.   Respiratory: Positive for shortness of breath.        Hemoptysis Can't get a good deep breath  Cardiovascular: Negative.   Gastrointestinal: Negative.   Musculoskeletal: Negative.   Skin: Negative.   Neurological: Negative.     Psychiatric/Behavioral: Negative.   All other systems reviewed and are negative.    Physical Exam Updated Vital Signs BP 133/79   Pulse 64   Temp 97.6 F (36.4 C) (Oral)   Resp 14   SpO2 99%   Physical Exam  Constitutional: She appears well-developed and well-nourished.  HENT:  Head: Normocephalic and atraumatic.  Eyes: Conjunctivae are normal. Pupils are equal, round, and reactive to light.  Neck: Neck supple. No tracheal deviation present. No thyromegaly present.  Cardiovascular: Normal rate and regular rhythm.   No murmur heard. Pulmonary/Chest: Effort normal and breath sounds normal.  Abdominal: Soft. Bowel sounds are normal. She exhibits no distension. There is no tenderness.  Obese  Musculoskeletal: Normal range of motion. She exhibits no edema or tenderness.  Neurological: She is alert. Coordination normal.  Skin: Skin is warm and dry. No rash noted.  Psychiatric: She has a normal mood and affect.  Nursing note and vitals reviewed.    ED Treatments / Results  Labs (all labs ordered are listed, but only abnormal results are displayed) Labs Reviewed - No data to display  EKG  EKG Interpretation None       Radiology Dg Chest 2 View  Result Date: 10/17/2016 CLINICAL DATA:  Hemoptysis EXAM: CHEST  2 VIEW COMPARISON:  None. FINDINGS: Lungs are clear. Heart is upper normal in size with pulmonary  vascularity within normal limits. No adenopathy. No bone lesions. IMPRESSION: No edema or consolidation. Electronically Signed   By: Bretta Bang III M.D.   On: 10/17/2016 11:59  Chest x-ray viewed by me  Procedures Procedures (including critical care time)  Medications Ordered in ED Medications - No data to display  Chest x-ray viewed by me Results for orders placed or performed in visit on 03/31/14  CBC with Differential  Result Value Ref Range   WBC 8.2 3.4 - 10.8 x10E3/uL   RBC 4.44 3.77 - 5.28 x10E6/uL   Hemoglobin 13.3 11.1 - 15.9 g/dL   HCT 16.1  09.6 - 04.5 %   MCV 91 79 - 97 fL   MCH 30.0 26.6 - 33.0 pg   MCHC 32.8 31.5 - 35.7 g/dL   RDW 40.9 81.1 - 91.4 %   Neutrophils Relative % 69 %   Lymphs 21 %   Monocytes 7 %   Eos 2 %   Basos 1 %   Neutrophils Absolute 5.6 1.4 - 7.0 x10E3/uL   Lymphocytes Absolute 1.7 0.7 - 3.1 x10E3/uL   Monocytes Absolute 0.6 0.1 - 0.9 x10E3/uL   Eosinophils Absolute 0.2 0.0 - 0.4 x10E3/uL   Basophils Absolute 0.1 0.0 - 0.2 x10E3/uL   Immature Granulocytes 0 %   Immature Grans (Abs) 0.0 0.0 - 0.1 x10E3/uL  Hepatic Function Panel  Result Value Ref Range   Total Protein 7.2 6.0 - 8.5 g/dL   Albumin 4.8 3.5 - 5.5 g/dL   Total Bilirubin 0.2 0.0 - 1.2 mg/dL   Bilirubin, Direct 7.82 0.00 - 0.40 mg/dL   Alkaline Phosphatase 105 39 - 117 IU/L   AST 23 0 - 40 IU/L   ALT 25 0 - 32 IU/L   Dg Chest 2 View  Result Date: 10/17/2016 CLINICAL DATA:  Hemoptysis EXAM: CHEST  2 VIEW COMPARISON:  None. FINDINGS: Lungs are clear. Heart is upper normal in size with pulmonary vascularity within normal limits. No adenopathy. No bone lesions. IMPRESSION: No edema or consolidation. Electronically Signed   By: Bretta Bang III M.D.   On: 10/17/2016 11:59   Ct Angio Chest Pe W Or Wo Contrast  Result Date: 10/17/2016 CLINICAL DATA:  Chlorine inhalation yesterday.  Hemoptysis today. EXAM: CT ANGIOGRAPHY CHEST WITH CONTRAST TECHNIQUE: Multidetector CT imaging of the chest was performed using the standard protocol during bolus administration of intravenous contrast. Multiplanar CT image reconstructions and MIPs were obtained to evaluate the vascular anatomy. CONTRAST:  80 cc Isovue 370 COMPARISON:  Chest radiography same day FINDINGS: Cardiovascular: Pulmonary arterial opacification is good. No pulmonary emboli. No systemic arterial abnormality is seen. No evidence of coronary or aortic calcification. No pericardial fluid. Mediastinum/Nodes: Normal Lungs/Pleura: Lungs do not show any acute process. There are a few benign  subpleural nodules in both lower lungs. No nodule that requires follow-up. No collapse or effusion. Upper Abdomen: Normal Musculoskeletal: Ordinary spinal degenerative changes. Review of the MIP images confirms the above findings. IMPRESSION: No acute finding by CT. No pulmonary emboli. No pulmonary parenchymal pathology. Few small benign subpleural nodules that do not require further follow-up. Electronically Signed   By: Paulina Fusi M.D.   On: 10/17/2016 14:30   Initial Impression / Assessment and Plan / ED Course  I have reviewed the triage vital signs and the nursing notes.  Pertinent labs & imaging results that were available during my care of the patient were reviewed by me and considered in my medical decision making (see chart for  details).     3:10 PM patient resting comfortably. Asymptomatic Hemoptysis and symptoms likely due to chemical irritant of respiratory tree. Plan return if symptoms worsen or follow-up with PMD  Final Clinical Impressions(s) / ED Diagnoses  Diagnosis hemoptysis Final diagnoses:  Hemoptysis    New Prescriptions New Prescriptions   No medications on file     Doug Sou, MD 10/17/16 1520

## 2016-10-17 NOTE — Discharge Instructions (Signed)
CT scan and chest x-ray today showed no cause for concern. Rest in the air conditioning today. And stay away from chemicals such as chlorine that may cause an irritation to your lungs. Return if concern for any reason or call your primary care physician

## 2016-10-17 NOTE — ED Notes (Signed)
Patient transported to CT 

## 2016-10-17 NOTE — ED Notes (Signed)
Pt in no acute distress at NF.

## 2016-10-17 NOTE — ED Triage Notes (Signed)
Pt reports coughing up blood this am after opening up some chlorine tablets yesterday. Denies chest pain or sob, ambulatory to triage, speaking in full sentences.

## 2017-01-20 DIAGNOSIS — H35362 Drusen (degenerative) of macula, left eye: Secondary | ICD-10-CM | POA: Diagnosis not present

## 2017-01-27 DIAGNOSIS — Z23 Encounter for immunization: Secondary | ICD-10-CM | POA: Diagnosis not present

## 2017-01-27 DIAGNOSIS — Z136 Encounter for screening for cardiovascular disorders: Secondary | ICD-10-CM | POA: Diagnosis not present

## 2017-01-27 DIAGNOSIS — Z0000 Encounter for general adult medical examination without abnormal findings: Secondary | ICD-10-CM | POA: Diagnosis not present

## 2017-01-30 DIAGNOSIS — Z780 Asymptomatic menopausal state: Secondary | ICD-10-CM | POA: Diagnosis not present

## 2017-03-11 DIAGNOSIS — J0100 Acute maxillary sinusitis, unspecified: Secondary | ICD-10-CM | POA: Diagnosis not present

## 2017-04-16 DIAGNOSIS — M791 Myalgia, unspecified site: Secondary | ICD-10-CM | POA: Diagnosis not present

## 2017-04-16 DIAGNOSIS — R509 Fever, unspecified: Secondary | ICD-10-CM | POA: Diagnosis not present

## 2017-04-17 DIAGNOSIS — R509 Fever, unspecified: Secondary | ICD-10-CM | POA: Diagnosis not present

## 2017-04-17 DIAGNOSIS — R51 Headache: Secondary | ICD-10-CM | POA: Diagnosis not present

## 2017-04-20 DIAGNOSIS — I83813 Varicose veins of bilateral lower extremities with pain: Secondary | ICD-10-CM | POA: Diagnosis not present

## 2017-05-05 DIAGNOSIS — B078 Other viral warts: Secondary | ICD-10-CM | POA: Diagnosis not present

## 2017-05-11 DIAGNOSIS — I83813 Varicose veins of bilateral lower extremities with pain: Secondary | ICD-10-CM | POA: Diagnosis not present

## 2017-06-12 DIAGNOSIS — I83813 Varicose veins of bilateral lower extremities with pain: Secondary | ICD-10-CM | POA: Diagnosis not present

## 2017-08-04 DIAGNOSIS — M19021 Primary osteoarthritis, right elbow: Secondary | ICD-10-CM | POA: Diagnosis not present

## 2017-08-15 DIAGNOSIS — M25521 Pain in right elbow: Secondary | ICD-10-CM | POA: Diagnosis not present

## 2017-08-26 DIAGNOSIS — M25521 Pain in right elbow: Secondary | ICD-10-CM | POA: Diagnosis not present

## 2017-08-29 DIAGNOSIS — M25521 Pain in right elbow: Secondary | ICD-10-CM | POA: Diagnosis not present

## 2017-09-05 DIAGNOSIS — M24021 Loose body in right elbow: Secondary | ICD-10-CM | POA: Diagnosis not present

## 2017-09-05 DIAGNOSIS — M25521 Pain in right elbow: Secondary | ICD-10-CM | POA: Diagnosis not present

## 2017-10-14 DIAGNOSIS — J209 Acute bronchitis, unspecified: Secondary | ICD-10-CM | POA: Diagnosis not present

## 2017-11-03 DIAGNOSIS — J209 Acute bronchitis, unspecified: Secondary | ICD-10-CM | POA: Diagnosis not present

## 2017-11-10 DIAGNOSIS — M24821 Other specific joint derangements of right elbow, not elsewhere classified: Secondary | ICD-10-CM | POA: Diagnosis not present

## 2017-11-10 DIAGNOSIS — M659 Synovitis and tenosynovitis, unspecified: Secondary | ICD-10-CM | POA: Diagnosis not present

## 2017-11-10 DIAGNOSIS — M24521 Contracture, right elbow: Secondary | ICD-10-CM | POA: Diagnosis not present

## 2017-11-10 DIAGNOSIS — M25521 Pain in right elbow: Secondary | ICD-10-CM | POA: Diagnosis not present

## 2017-11-10 DIAGNOSIS — M19021 Primary osteoarthritis, right elbow: Secondary | ICD-10-CM | POA: Diagnosis not present

## 2017-11-20 DIAGNOSIS — M25521 Pain in right elbow: Secondary | ICD-10-CM | POA: Diagnosis not present

## 2017-11-23 DIAGNOSIS — M19029 Primary osteoarthritis, unspecified elbow: Secondary | ICD-10-CM | POA: Diagnosis not present

## 2017-11-28 DIAGNOSIS — M25629 Stiffness of unspecified elbow, not elsewhere classified: Secondary | ICD-10-CM | POA: Diagnosis not present

## 2017-12-01 DIAGNOSIS — M25629 Stiffness of unspecified elbow, not elsewhere classified: Secondary | ICD-10-CM | POA: Diagnosis not present

## 2017-12-05 DIAGNOSIS — M25621 Stiffness of right elbow, not elsewhere classified: Secondary | ICD-10-CM | POA: Diagnosis not present

## 2017-12-08 DIAGNOSIS — M25621 Stiffness of right elbow, not elsewhere classified: Secondary | ICD-10-CM | POA: Diagnosis not present

## 2017-12-12 DIAGNOSIS — M25621 Stiffness of right elbow, not elsewhere classified: Secondary | ICD-10-CM | POA: Diagnosis not present

## 2017-12-15 DIAGNOSIS — M25629 Stiffness of unspecified elbow, not elsewhere classified: Secondary | ICD-10-CM | POA: Diagnosis not present

## 2017-12-22 DIAGNOSIS — M25621 Stiffness of right elbow, not elsewhere classified: Secondary | ICD-10-CM | POA: Diagnosis not present

## 2018-02-02 DIAGNOSIS — H35362 Drusen (degenerative) of macula, left eye: Secondary | ICD-10-CM | POA: Diagnosis not present

## 2018-03-23 DIAGNOSIS — Z23 Encounter for immunization: Secondary | ICD-10-CM | POA: Diagnosis not present

## 2018-03-23 DIAGNOSIS — Z136 Encounter for screening for cardiovascular disorders: Secondary | ICD-10-CM | POA: Diagnosis not present

## 2018-03-23 DIAGNOSIS — Z0000 Encounter for general adult medical examination without abnormal findings: Secondary | ICD-10-CM | POA: Diagnosis not present

## 2018-06-21 DIAGNOSIS — J00 Acute nasopharyngitis [common cold]: Secondary | ICD-10-CM | POA: Diagnosis not present

## 2018-07-03 IMAGING — DX DG CHEST 2V
2 series · 2 of 2 positions shown · non-contrast
Comparison: None.

CLINICAL DATA: Hemoptysis

EXAM:
CHEST  2 VIEW

[chest pa]
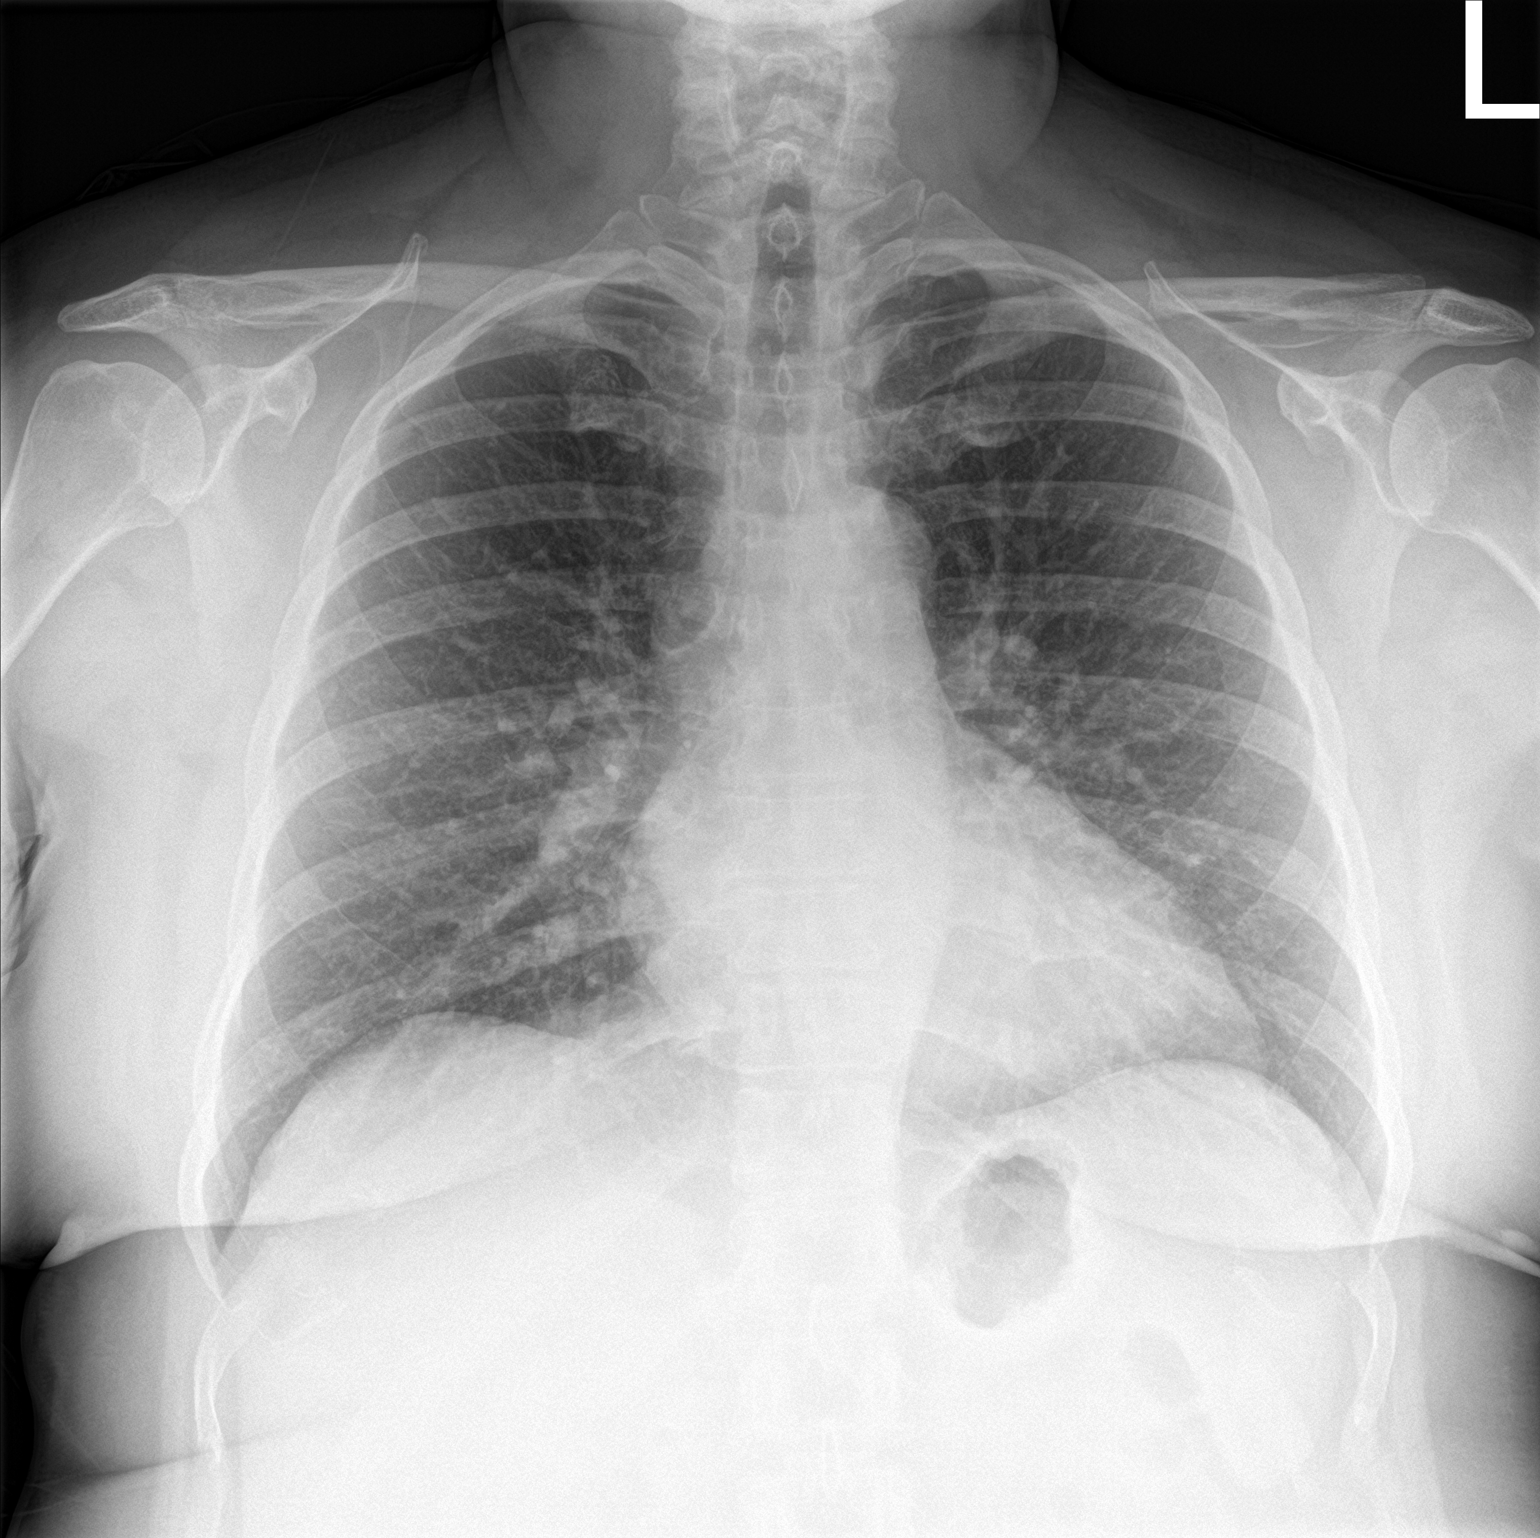

[chest lat]
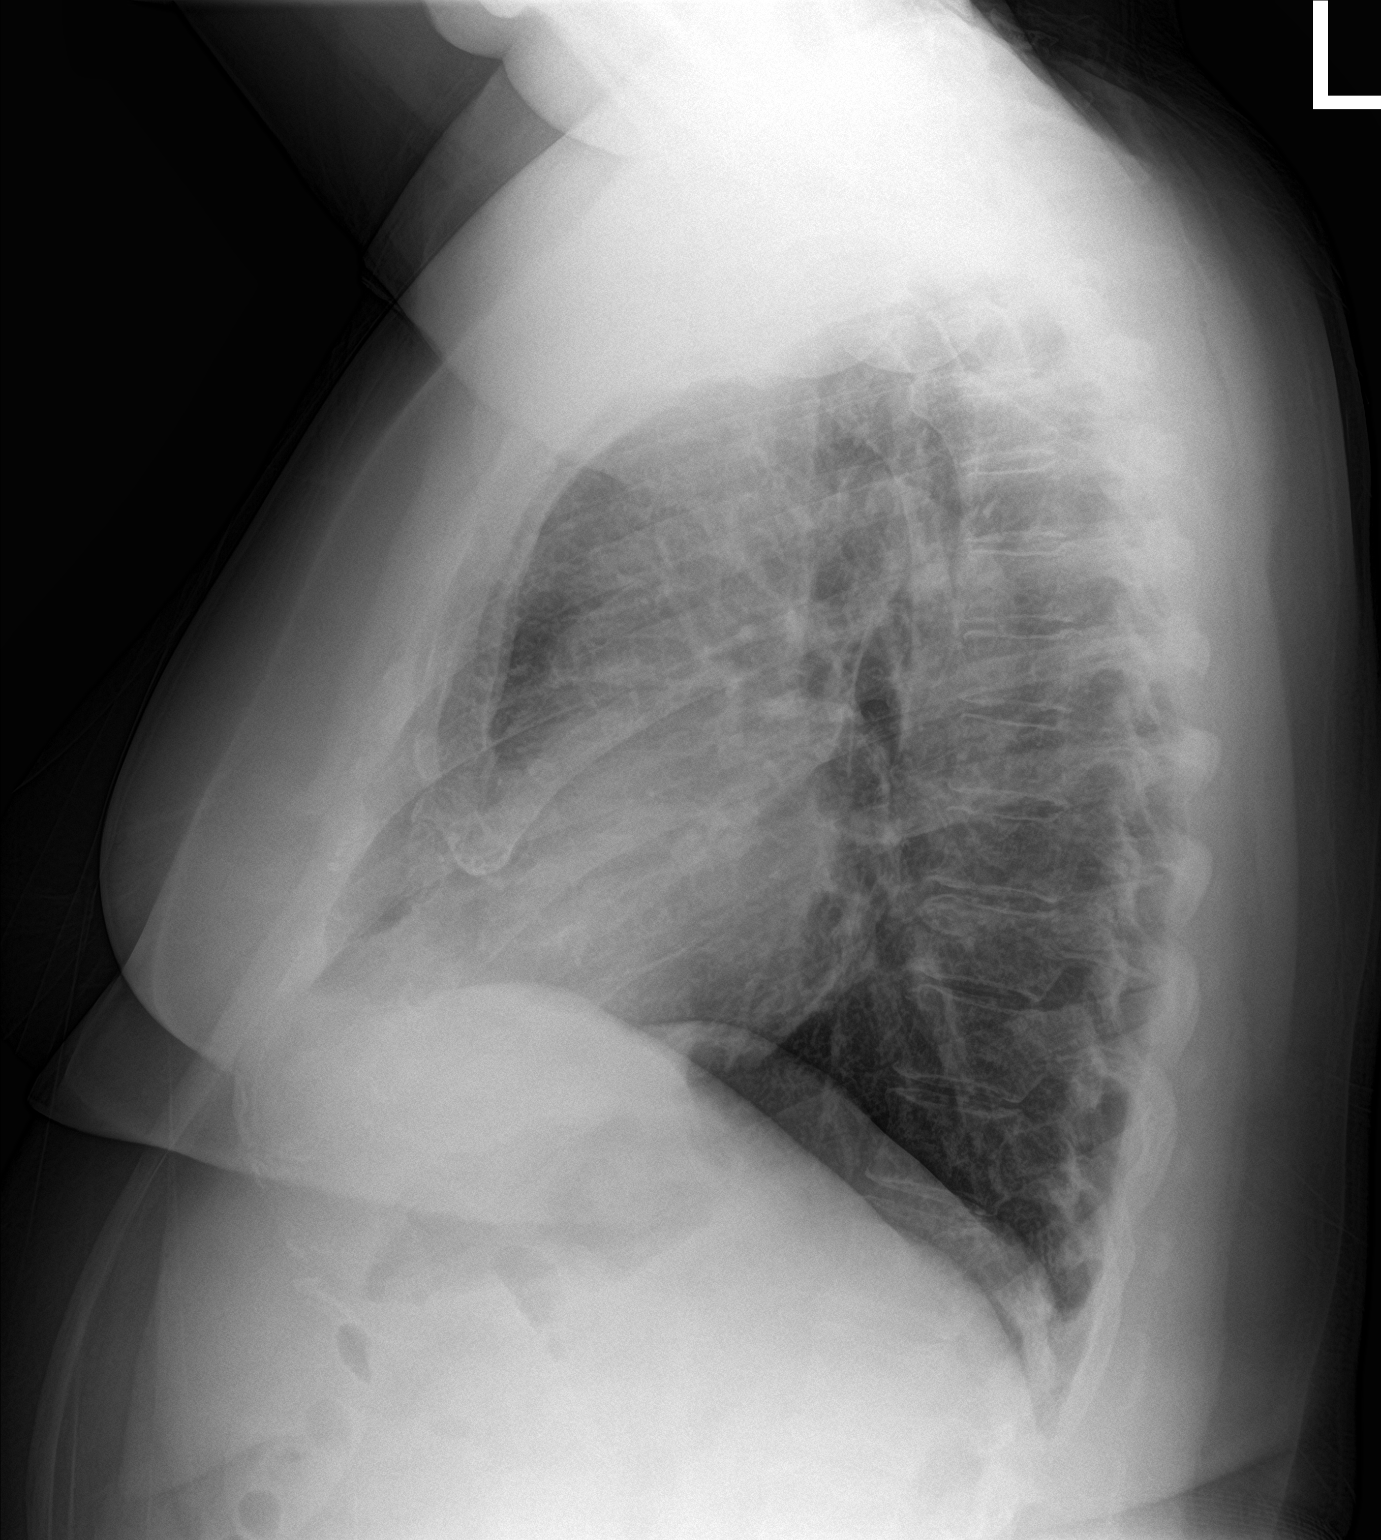

[2 of 2 positions shown; findings below may reference images not displayed]

FINDINGS: Lungs are clear. Heart is upper normal in size with pulmonary
vascularity within normal limits. No adenopathy. No bone lesions.
IMPRESSION: No edema or consolidation.

## 2019-04-23 ENCOUNTER — Other Ambulatory Visit: Payer: Self-pay

## 2019-04-23 DIAGNOSIS — Z20822 Contact with and (suspected) exposure to covid-19: Secondary | ICD-10-CM

## 2019-04-25 LAB — NOVEL CORONAVIRUS, NAA: SARS-CoV-2, NAA: NOT DETECTED

## 2020-01-24 ENCOUNTER — Ambulatory Visit
Admission: EM | Admit: 2020-01-24 | Discharge: 2020-01-24 | Disposition: A | Discharge status: Home / Self Care | Payer: 59 | Attending: Emergency Medicine | Admitting: Emergency Medicine

## 2020-01-24 ENCOUNTER — Other Ambulatory Visit: Payer: Self-pay

## 2020-01-24 DIAGNOSIS — Z1152 Encounter for screening for COVID-19: Secondary | ICD-10-CM | POA: Diagnosis not present

## 2020-01-24 NOTE — ED Triage Notes (Signed)
Pt had coivd exposure from son. Denies sx's.

## 2020-01-24 NOTE — Discharge Instructions (Signed)

## 2020-01-27 LAB — NOVEL CORONAVIRUS, NAA: SARS-CoV-2, NAA: NOT DETECTED

## 2020-02-11 ENCOUNTER — Encounter: Payer: Self-pay | Admitting: Internal Medicine

## 2020-02-11 ENCOUNTER — Ambulatory Visit (HOSPITAL_COMMUNITY)
Admission: RE | Admit: 2020-02-11 | Discharge: 2020-02-11 | Disposition: A | Discharge status: Home / Self Care | Payer: 59 | Source: Ambulatory Visit | Attending: Pulmonary Disease | Admitting: Pulmonary Disease

## 2020-02-11 ENCOUNTER — Other Ambulatory Visit: Payer: Self-pay | Admitting: Internal Medicine

## 2020-02-11 DIAGNOSIS — E669 Obesity, unspecified: Secondary | ICD-10-CM | POA: Diagnosis present

## 2020-02-11 DIAGNOSIS — U071 COVID-19: Secondary | ICD-10-CM | POA: Diagnosis present

## 2020-02-11 MED ORDER — METHYLPREDNISOLONE SODIUM SUCC 125 MG IJ SOLR: 125.0000 mg | Freq: Once | INTRAMUSCULAR | Status: DC | PRN

## 2020-02-11 MED ORDER — EPINEPHRINE 0.3 MG/0.3ML IJ SOAJ: 0.3000 mg | Freq: Once | INTRAMUSCULAR | Status: DC | PRN

## 2020-02-11 MED ORDER — FAMOTIDINE IN NACL 20-0.9 MG/50ML-% IV SOLN: 20.0000 mg | Freq: Once | INTRAVENOUS | Status: DC | PRN

## 2020-02-11 MED ORDER — ALBUTEROL SULFATE HFA 108 (90 BASE) MCG/ACT IN AERS: 2.0000 | INHALATION_SPRAY | Freq: Once | RESPIRATORY_TRACT | Status: DC | PRN

## 2020-02-11 MED ORDER — SODIUM CHLORIDE 0.9 % IV SOLN
1200.0000 mg | Freq: Once | INTRAVENOUS | Status: AC
  Administered 2020-02-11: 16:00:00 1200 mg via INTRAVENOUS

## 2020-02-11 MED ORDER — DIPHENHYDRAMINE HCL 50 MG/ML IJ SOLN: 50.0000 mg | Freq: Once | INTRAMUSCULAR | Status: DC | PRN

## 2020-02-11 MED ORDER — SODIUM CHLORIDE 0.9 % IV SOLN: INTRAVENOUS | Status: DC | PRN

## 2020-02-11 NOTE — Discharge Instructions (Signed)

## 2020-02-11 NOTE — Progress Notes (Signed)
I connected by phone with Veronica Zimmerman on 02/11/2020 at 11:40 AM to discuss the potential use of a new treatment for mild to moderate COVID-19 viral infection in non-hospitalized patients.  This patient is a 59 y.o. female that meets the FDA criteria for Emergency Use Authorization of COVID monoclonal antibody casirivimab/imdevimab or bamlanivimab/eteseviamb.  Has a (+) direct SARS-CoV-2 viral test result  Has mild or moderate COVID-19   Is NOT hospitalized due to COVID-19  Is within 10 days of symptom onset  Has at least one of the high risk factor(s) for progression to severe COVID-19 and/or hospitalization as defined in EUA.  Specific high risk criteria : BMI > 25   I have spoken and communicated the following to the patient or parent/caregiver regarding COVID monoclonal antibody treatment:  1. FDA has authorized the emergency use for the treatment of mild to moderate COVID-19 in adults and pediatric patients with positive results of direct SARS-CoV-2 viral testing who are 33 years of age and older weighing at least 40 kg, and who are at high risk for progressing to severe COVID-19 and/or hospitalization.  2. The significant known and potential risks and benefits of COVID monoclonal antibody, and the extent to which such potential risks and benefits are unknown.  3. Information on available alternative treatments and the risks and benefits of those alternatives, including clinical trials.  4. Patients treated with COVID monoclonal antibody should continue to self-isolate and use infection control measures (e.g., wear mask, isolate, social distance, avoid sharing personal items, clean and disinfect "high touch" surfaces, and frequent handwashing) according to CDC guidelines.   5. The patient or parent/caregiver has the option to accept or refuse COVID monoclonal antibody treatment.  After reviewing this information with the patient, the patient has agreed to receive one of the  available covid 19 monoclonal antibodies and will be provided an appropriate fact sheet prior to infusion. [name], MD 02/11/2020 11:40 AM

## 2020-02-11 NOTE — Progress Notes (Signed)
  Diagnosis: COVID-19  Physician: Dr. Wright  Procedure: [facility] Med: casirivimab\imdevimab infusion - Provided patient with casirivimab\imdevimab fact sheet for patients, parents and caregivers prior to infusion.  Complications: No immediate complications noted.  Discharge: Discharged home   Veronica Zimmerman 02/11/2020  

## 2020-05-29 ENCOUNTER — Other Ambulatory Visit: Payer: Self-pay

## 2020-05-29 ENCOUNTER — Encounter: Payer: Self-pay | Admitting: Emergency Medicine

## 2020-05-29 ENCOUNTER — Ambulatory Visit
Admission: EM | Admit: 2020-05-29 | Discharge: 2020-05-29 | Disposition: A | Discharge status: Home / Self Care | Payer: 59 | Attending: Emergency Medicine | Admitting: Emergency Medicine

## 2020-05-29 DIAGNOSIS — J029 Acute pharyngitis, unspecified: Secondary | ICD-10-CM | POA: Diagnosis present

## 2020-05-29 LAB — POCT RAPID STREP A (OFFICE): Rapid Strep A Screen: NEGATIVE

## 2020-05-29 MED ORDER — SUCRALFATE 1 GM/10ML PO SUSP: 1.0000 g | Freq: Three times a day (TID) | ORAL | 0 refills | Status: DC

## 2020-05-29 NOTE — Discharge Instructions (Addendum)

## 2020-05-29 NOTE — ED Provider Notes (Signed)
[facility]    CSN: [id number] Arrival date & time: 05/29/20  0859      History   Chief Complaint Chief Complaint  Patient presents with  . Sore Throat    HPI Veronica Zimmerman is a 60 y.o. female  History was provided by the patient. Veronica Zimmerman is a 60 y.o. female who presents for evaluation of a sore throat. Associated symptoms include nasal blockage, post nasal drip, sinus and nasal congestion and sore throat. Onset of symptoms was 3 days ago, gradually worsening since that time.  She is drinking plenty of fluids. She has not had recent close exposure to someone with proven streptococcal pharyngitis.  Is not COVID vaccinated: Endorses history of acute COVID-19 infection in September 2021. The following portions of the patient's history were reviewed and updated as appropriate: allergies, current medications, past family history, past medical history, past social history, past surgical history and problem list.     Past Medical History:  Diagnosis Date  . Allergy     There are no problems to display for this patient.   Past Surgical History:  Procedure Laterality Date  . ABDOMINAL HYSTERECTOMY      OB History   No obstetric history on file.      Home Medications    Prior to Admission medications   Medication Sig Start Date End Date Taking? Authorizing Provider  sucralfate (CARAFATE) 1 GM/10ML suspension Take 10 mLs (1 g total) by mouth 4 (four) times daily -  with meals and at bedtime. 05/29/20  Yes Hall-Potvin, [place], PA-C  diclofenac (VOLTAREN) 75 MG EC tablet Take 1 tablet (75 mg total) by mouth 2 (two) times daily. Patient not taking: Reported on 10/17/2016 02/25/16   Hyatt, Max T, DPM  fluticasone (FLONASE) 50 MCG/ACT nasal spray Place 2 sprays into both nostrils at bedtime. 09/29/16   [provider]  meloxicam (MOBIC) 15 MG tablet TAKE 1 TABLET (15 MG TOTAL) BY MOUTH DAILY. 04/04/16   Hyatt, Annye Rusk, DPM    Family  History History reviewed. No pertinent family history.  Social History Social History   Tobacco Use  . Smoking status: Never Smoker  Substance Use Topics  . Alcohol use: No  . Drug use: No     Allergies   Patient has no known allergies.   Review of Systems Review of Systems  Constitutional: Negative for fatigue and fever.  HENT: Positive for congestion, postnasal drip, rhinorrhea and sore throat. Negative for dental problem, ear pain, facial swelling, hearing loss, sinus pain, trouble swallowing and voice change.   Eyes: Negative for photophobia, pain and visual disturbance.  Respiratory: Negative for cough and shortness of breath.   Cardiovascular: Negative for chest pain and palpitations.  Gastrointestinal: Negative for diarrhea and vomiting.  Musculoskeletal: Negative for arthralgias and myalgias.  Neurological: Negative for dizziness and headaches.     Physical Exam Triage Vital Signs ED Triage Vitals  Enc Vitals Group     BP 05/29/20 0949 (!) 145/84     Pulse Rate 05/29/20 0949 75     Resp 05/29/20 0949 16     Temp --      Temp Source 05/29/20 0949 Oral     SpO2 05/29/20 0949 97 %     Weight --      Height --      Head Circumference --      Peak Flow --      Pain Score 05/29/20 0948 2  Pain Loc --      Pain Edu? --      Excl. in GC? --    No data found.  Updated Vital Signs BP (!) 145/84 (BP Location: Right Arm)   Pulse 75   Temp 98.1 F (36.7 C) (Oral)   Resp 16   SpO2 97%   Visual Acuity Right Eye Distance:   Left Eye Distance:   Bilateral Distance:    Right Eye Near:   Left Eye Near:    Bilateral Near:     Physical Exam Constitutional:      General: She is not in acute distress. HENT:     Head: Normocephalic and atraumatic.     Jaw: There is normal jaw occlusion. No tenderness or pain on movement.     Right Ear: Hearing, tympanic membrane, ear canal and external ear normal. No tenderness. No mastoid tenderness.     Left Ear:  Hearing, tympanic membrane, ear canal and external ear normal. No tenderness. No mastoid tenderness.     Nose: No nasal deformity, septal deviation or nasal tenderness.     Right Turbinates: Not swollen or pale.     Left Turbinates: Not swollen or pale.     Right Sinus: No maxillary sinus tenderness or frontal sinus tenderness.     Left Sinus: No maxillary sinus tenderness or frontal sinus tenderness.     Mouth/Throat:     Lips: Pink. No lesions.     Mouth: Mucous membranes are moist. No injury.     Pharynx: Oropharynx is clear. Uvula midline. Posterior oropharyngeal erythema present. No oropharyngeal exudate or uvula swelling.     Comments: Tonsillar hypertrophy.  Patient does have single cold sore without discharge or bleeding Cardiovascular:     Rate and Rhythm: Normal rate.  Pulmonary:     Effort: Pulmonary effort is normal.  Musculoskeletal:     Cervical back: Normal range of motion and neck supple. No muscular tenderness.  Lymphadenopathy:     Cervical: No cervical adenopathy.  Neurological:     Mental Status: She is alert and oriented to person, place, and time.      UC Treatments / Results  Labs (all labs ordered are listed, but only abnormal results are displayed) Labs Reviewed  CULTURE, GROUP A STREP [facility])  POCT RAPID STREP A (OFFICE)    EKG   Radiology No results found.  Procedures Procedures (including critical care time)  Medications Ordered in UC Medications - No data to display  Initial Impression / Assessment and Plan / UC Course  I have reviewed the triage vital signs and the nursing notes.  Pertinent labs & imaging results that were available during my care of the patient were reviewed by me and considered in my medical decision making (see chart for details).     Patient afebrile, nontoxic in office today.  Airway is patent.  Rapid strep negative, culture pending.  Will treat supportively as below.  Patient declines COVID testing today.  Return  precautions discussed, pt verbalized understanding and is agreeable to plan. Final Clinical Impressions(s) / UC Diagnoses   Final diagnoses:  Acute pharyngitis, unspecified etiology     Discharge Instructions     Your rapid strep test was negative today.  The culture is pending.  Please look on your MyChart for test results.   We will notify you if the culture positive and outline a treatment plan at that time.   Please continue Tylenol and/or Ibuprofen as needed for fever,  pain.  May try warm salt water gargles, cepacol lozenges, throat spray, warm tea or water with lemon/honey, or OTC cold relief medicine for throat discomfort.   For congestion: take a daily anti-histamine like Zyrtec, Claritin, and a oral decongestant to help with post nasal drip that may be irritating your throat.   It is important to stay hydrated: drink plenty of fluids (primarily water) to keep your throat moisturized and help further relieve irritation/discomfort.     ED Prescriptions    Medication Sig Dispense Auth. Provider   sucralfate (CARAFATE) 1 GM/10ML suspension Take 10 mLs (1 g total) by mouth 4 (four) times daily -  with meals and at bedtime. 420 mL Hall-Potvin, [place], PA-C     PDMP not reviewed this encounter.   Hall-Potvin, [place], [place] 05/29/20 1054

## 2020-05-29 NOTE — ED Triage Notes (Signed)
Pt said she has been having sore throat and white spots on her throat for 3 days. Been gargling salt water no relief. No fevers, no chills,

## 2020-05-31 LAB — CULTURE, GROUP A STREP (THRC)

## 2020-11-01 ENCOUNTER — Other Ambulatory Visit: Payer: Self-pay

## 2020-11-01 ENCOUNTER — Ambulatory Visit
Admission: EM | Admit: 2020-11-01 | Discharge: 2020-11-01 | Disposition: A | Discharge status: Home / Self Care | Payer: 59

## 2020-11-01 DIAGNOSIS — S29012A Strain of muscle and tendon of back wall of thorax, initial encounter: Secondary | ICD-10-CM

## 2020-11-01 MED ORDER — KETOROLAC TROMETHAMINE 30 MG/ML IJ SOLN
30.0000 mg | Freq: Once | INTRAMUSCULAR | Status: AC
  Administered 2020-11-01: 09:00:00 30 mg via INTRAMUSCULAR

## 2020-11-01 MED ORDER — CYCLOBENZAPRINE HCL 5 MG PO TABS: 5.0000 mg | ORAL_TABLET | Freq: Three times a day (TID) | ORAL | 0 refills | Status: DC | PRN

## 2020-11-01 MED ORDER — PREDNISONE 10 MG (21) PO TBPK: ORAL_TABLET | Freq: Every day | ORAL | 0 refills | Status: DC

## 2020-11-01 NOTE — Discharge Instructions (Addendum)
Apply ice to affected area Take medications as prescribed Recommend light stretching If no improvement or symptoms worsen return for evaluation or follow up with primary care

## 2020-11-01 NOTE — ED Triage Notes (Signed)
Patient presents to Urgent Care with complaints of back pain x 3 weeks. Pt states pain increases with movement and radiates to left abdominal area. She states she is unsure if she pulled a muscle detailing her car. She describes this pain as a "tearing hot pain." Pain is somewhat relieved laying on the affected side. Treating pain with heat pads and tylenol with no relief.   Denies fever.

## 2020-11-01 NOTE — ED Provider Notes (Signed)
[facility]    CSN: [id number] Arrival date & time: 11/01/20  0810      History   Chief Complaint Chief Complaint  Patient presents with   Back Pain    HPI Veronica Zimmerman is a 60 y.o. female.   Pt complains of left upper back pain that started about three weeks ago.  No injury or trauma.  Initially thought it was due to washing her car, pain started three days after doing this.  Denies chest pain, radiation of pain, numbness or tingling, no saddle anesthesia, no loss of bowel or bladder control.  Reports sometimes feels pain with deep breathing.  She is having trouble sleeping due to pain.  Sitting, standing, breathing, movement makes the pain worse.  She is using heating pad and taking tylenol with minimal relief.    Past Medical History:  Diagnosis Date   Allergy     There are no problems to display for this patient.   Past Surgical History:  Procedure Laterality Date   ABDOMINAL HYSTERECTOMY      OB History   No obstetric history on file.      Home Medications    Prior to Admission medications   Medication Sig Start Date End Date Taking? Authorizing Provider  diclofenac (VOLTAREN) 75 MG EC tablet Take 1 tablet (75 mg total) by mouth 2 (two) times daily. Patient not taking: No sig reported 02/25/16   Hyatt, Max T, DPM  fluticasone (FLONASE) 50 MCG/ACT nasal spray Place 2 sprays into both nostrils at bedtime. 09/29/16   [provider]  meloxicam (MOBIC) 15 MG tablet TAKE 1 TABLET (15 MG TOTAL) BY MOUTH DAILY. 04/04/16   Hyatt, Max T, DPM  rosuvastatin (CRESTOR) 10 MG tablet Take 10 mg by mouth daily. 06/30/20   [provider]  sucralfate (CARAFATE) 1 GM/10ML suspension Take 10 mLs (1 g total) by mouth 4 (four) times daily -  with meals and at bedtime. 05/29/20   Hall-Potvin, [place], PA-C    Family History History reviewed. No pertinent family history.  Social History Social History   Tobacco Use   Smoking status:  Never   Smokeless tobacco: Never  Substance Use Topics   Alcohol use: No   Drug use: No     Allergies   Patient has no known allergies.   Review of Systems Review of Systems  Constitutional:  Negative for chills and fever.  HENT:  Negative for ear pain and sore throat.   Eyes:  Negative for pain and visual disturbance.  Respiratory:  Negative for cough and shortness of breath.   Cardiovascular:  Negative for chest pain and palpitations.  Gastrointestinal:  Negative for abdominal pain and vomiting.  Genitourinary:  Negative for dysuria and hematuria.  Musculoskeletal:  Positive for back pain (left upper back). Negative for arthralgias.  Skin:  Negative for color change and rash.  Neurological:  Negative for seizures, syncope, weakness and numbness.  All other systems reviewed and are negative.   Physical Exam Triage Vital Signs ED Triage Vitals  Enc Vitals Group     BP 11/01/20 0831 118/75     Pulse Rate 11/01/20 0831 70     Resp 11/01/20 0831 18     Temp 11/01/20 0831 98.6 F (37 C)     Temp Source 11/01/20 0831 Oral     SpO2 11/01/20 0831 96 %     Weight --      Height --      Head  Circumference --      Peak Flow --      Pain Score 11/01/20 0839 9     Pain Loc --      Pain Edu? --      Excl. in GC? --    No data found.  Updated Vital Signs BP 118/75 (BP Location: Left Arm)   Pulse 70   Temp 98.6 F (37 C) (Oral)   Resp 18   SpO2 96%   Visual Acuity Right Eye Distance:   Left Eye Distance:   Bilateral Distance:    Right Eye Near:   Left Eye Near:    Bilateral Near:     Physical Exam Vitals and nursing note reviewed.  Constitutional:      General: She is not in acute distress.    Appearance: She is well-developed.  HENT:     Head: Normocephalic and atraumatic.  Eyes:     Conjunctiva/sclera: Conjunctivae normal.  Cardiovascular:     Rate and Rhythm: Normal rate and regular rhythm.     Heart sounds: No murmur heard. Pulmonary:     Effort:  Pulmonary effort is normal. No respiratory distress.     Breath sounds: Normal breath sounds.  Abdominal:     Palpations: Abdomen is soft.     Tenderness: There is no abdominal tenderness.  Musculoskeletal:     Right upper arm: Normal.     Left upper arm: Normal.       Arms:     Cervical back: Neck supple.  Skin:    General: Skin is warm and dry.  Neurological:     Mental Status: She is alert.     UC Treatments / Results  Labs (all labs ordered are listed, but only abnormal results are displayed) Labs Reviewed - No data to display  EKG   Radiology No results found.  Procedures Procedures (including critical care time)  Medications Ordered in UC Medications - No data to display  Initial Impression / Assessment and Plan / UC Course  I have reviewed the triage vital signs and the nursing notes.  Pertinent labs & imaging results that were available during my care of the patient were reviewed by me and considered in my medical decision making (see chart for details).     Pain appears musculoskeletal, increased pain with movement, deep breathing, palpation.  Toradol given in clinic today.  Flexeril and prednisone sent to pharmacy. Advised cold compress and light stretching.  If no improvement or symptoms worsen follow up with primary care.  Final Clinical Impressions(s) / UC Diagnoses   Final diagnoses:  None   Discharge Instructions   None    ED Prescriptions   None    PDMP not reviewed this encounter.   Jodell Cipro, PA-C 11/01/20 0539

## 2020-11-03 ENCOUNTER — Other Ambulatory Visit: Payer: Self-pay | Admitting: Internal Medicine

## 2020-11-03 ENCOUNTER — Ambulatory Visit
Admission: RE | Admit: 2020-11-03 | Discharge: 2020-11-03 | Disposition: A | Discharge status: Home / Self Care | Payer: 59 | Source: Ambulatory Visit | Attending: Internal Medicine | Admitting: Internal Medicine

## 2020-11-03 DIAGNOSIS — R079 Chest pain, unspecified: Secondary | ICD-10-CM

## 2020-11-05 ENCOUNTER — Other Ambulatory Visit: Payer: Self-pay | Admitting: Internal Medicine

## 2020-11-05 DIAGNOSIS — R079 Chest pain, unspecified: Secondary | ICD-10-CM

## 2020-11-13 ENCOUNTER — Ambulatory Visit
Admission: RE | Admit: 2020-11-13 | Discharge: 2020-11-13 | Disposition: A | Discharge status: Home / Self Care | Payer: 59 | Source: Ambulatory Visit | Attending: Internal Medicine | Admitting: Internal Medicine

## 2020-11-13 DIAGNOSIS — R079 Chest pain, unspecified: Secondary | ICD-10-CM

## 2020-11-13 MED ORDER — IOPAMIDOL (ISOVUE-370) INJECTION 76%
75.0000 mL | Freq: Once | INTRAVENOUS | Status: AC | PRN
  Administered 2020-11-13: 75 mL via INTRAVENOUS

## 2022-03-03 ENCOUNTER — Ambulatory Visit (INDEPENDENT_AMBULATORY_CARE_PROVIDER_SITE_OTHER): Payer: 59 | Admitting: Podiatry

## 2022-03-03 ENCOUNTER — Encounter: Payer: Self-pay | Admitting: Podiatry

## 2022-03-03 DIAGNOSIS — M722 Plantar fascial fibromatosis: Secondary | ICD-10-CM

## 2022-03-03 NOTE — Progress Notes (Signed)
Subjective:  Patient ID: Veronica Zimmerman, female    DOB: 03-26-1961,  MRN: [id number]  Chief Complaint  Patient presents with   Injections    61 y.o. female presents with the above complaint.  Patient presents with mild midfoot Planter fasciitis type of pain.  Patient states been ongoing for quite some time is feeling a lot better.  She feels like sometimes it stabs right into the arch and feels like walking on a rock/pebble.  She is going away in the beach in 2 weeks.  She would like to know if she can do a steroid shot that has helped in the past.  She has not been seen in our office in over 3 years.   Review of Systems: Negative except as noted in the HPI. Denies N/V/F/Ch.  Past Medical History:  Diagnosis Date   Allergy     Current Outpatient Medications:    cyclobenzaprine (FLEXERIL) 5 MG tablet, Take 1 tablet (5 mg total) by mouth 3 (three) times daily as needed for muscle spasms., Disp: 30 tablet, Rfl: 0   diclofenac (VOLTAREN) 75 MG EC tablet, Take 1 tablet (75 mg total) by mouth 2 (two) times daily. (Patient not taking: No sig reported), Disp: 60 tablet, Rfl: 3   fluticasone (FLONASE) 50 MCG/ACT nasal spray, Place 2 sprays into both nostrils at bedtime., Disp: , Rfl: 3   meloxicam (MOBIC) 15 MG tablet, TAKE 1 TABLET (15 MG TOTAL) BY MOUTH DAILY., Disp: 30 tablet, Rfl: 0   predniSONE (STERAPRED UNI-PAK 21 TAB) 10 MG (21) TBPK tablet, Take by mouth daily. Take 6 tabs by mouth daily  for 2 days, then 5 tabs for 2 days, then 4 tabs for 2 days, then 3 tabs for 2 days, 2 tabs for 2 days, then 1 tab by mouth daily for 2 days, Disp: 42 tablet, Rfl: 0   rosuvastatin (CRESTOR) 10 MG tablet, Take 10 mg by mouth daily., Disp: , Rfl:    sucralfate (CARAFATE) 1 GM/10ML suspension, Take 10 mLs (1 g total) by mouth 4 (four) times daily -  with meals and at bedtime., Disp: 420 mL, Rfl: 0  Social History   Tobacco Use  Smoking Status Never  Smokeless Tobacco Never    No Known  Allergies Objective:  There were no vitals filed for this visit. There is no height or weight on file to calculate BMI. Constitutional Well developed. Well nourished.  Vascular Dorsalis pedis pulses palpable bilaterally. Posterior tibial pulses palpable bilaterally. Capillary refill normal to all digits.  No cyanosis or clubbing noted. Pedal hair growth normal.  Neurologic Normal speech. Oriented to person, place, and time. Epicritic sensation to light touch grossly present bilaterally.  Dermatologic Nails well groomed and normal in appearance. No open wounds. No skin lesions.  Orthopedic: Normal joint ROM without pain or crepitus bilaterally. No visible deformities. Tender to palpation at the midfoot of the arch.  No pain at the calcaneal tuber right side No pain with calcaneal squeeze right. Ankle ROM diminished range of motion right. Silfverskiold Test: positive right.   Radiographs: None  Assessment:   1. Plantar fasciitis of right foot    Plan:  Patient was evaluated and treated and all questions answered.  Plantar Fasciitis, right midfoot - XR reviewed as above.  - Educated on icing and stretching. Instructions given.  - Injection delivered to the plantar fascia as below. - DME: None - Pharmacologic management: None  Procedure: Injection Tendon/Ligament Location: Right plantar fascia at the mid foot;  medial approach. Skin Prep: alcohol Injectate: 0.5 cc 0.5% marcaine plain, 0.5 cc of 1% Lidocaine, 0.5 cc kenalog 10. Disposition: Patient tolerated procedure well. Injection site dressed with a band-aid.  No follow-ups on file.

## 2022-07-30 IMAGING — CT CT ANGIO CHEST
2 of 6 series · 13 of 36 positions shown · IV contrast (iopamidol)
Comparison: None.

CLINICAL DATA: Left posterior chest pain for 1 month

EXAM:
CT ANGIOGRAPHY CHEST WITH CONTRAST
TECHNIQUE: Multidetector CT imaging of the chest was performed using the
standard protocol during bolus administration of intravenous
contrast. Multiplanar CT image reconstructions and MIPs were
obtained to evaluate the vascular anatomy.
CONTRAST:  75mL J355LD-YPZ IOPAMIDOL (J355LD-YPZ) INJECTION 76%

[Series 7: cta thorax 2.00 bv36 s3 axial arterial · axial · arterial · 0.60mm/px · z∈[+1501,+1783]mm · 12 of 167 slices shown]
[im 13/167  lung]
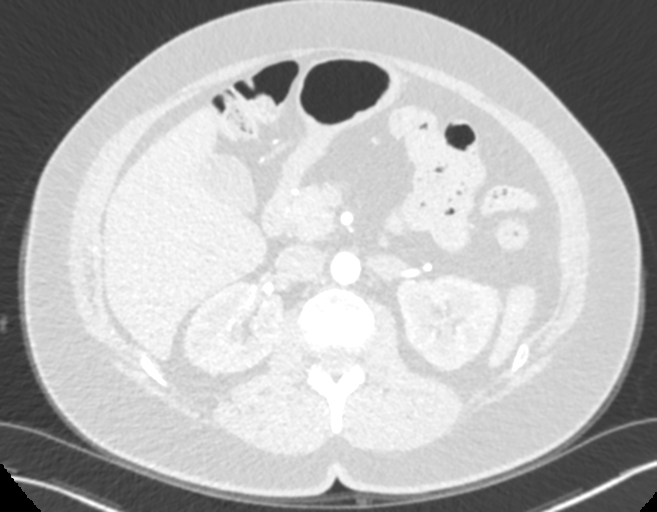
[im 26/167  mediastinal]
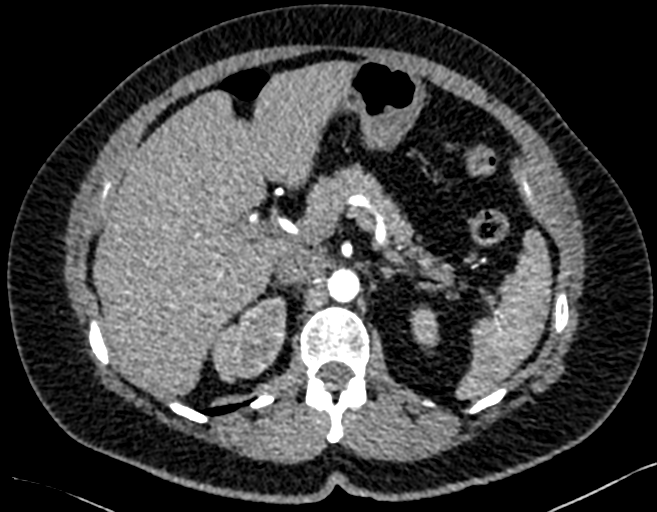
[im 39/167  lung]
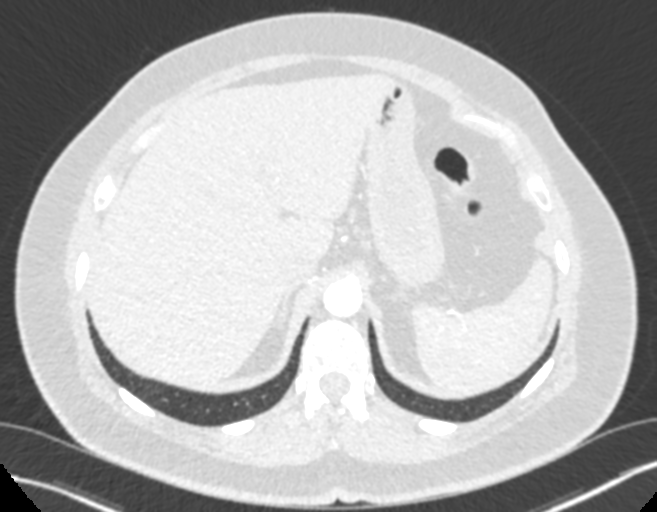
[im 52/167  mediastinal]
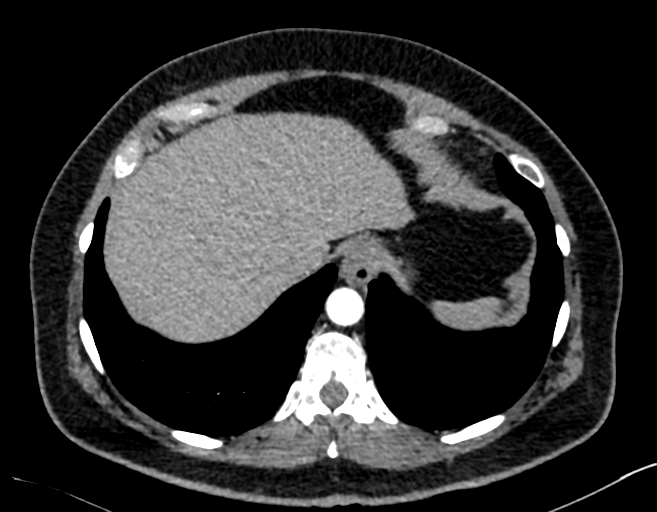
[im 64/167  lung]
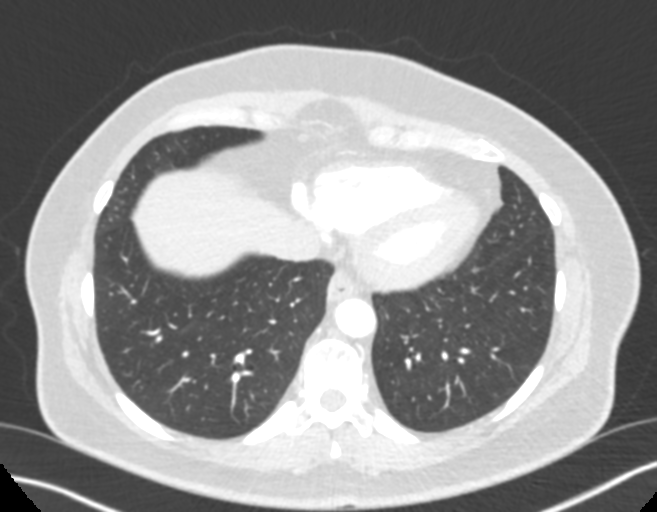
[im 77/167  mediastinal]
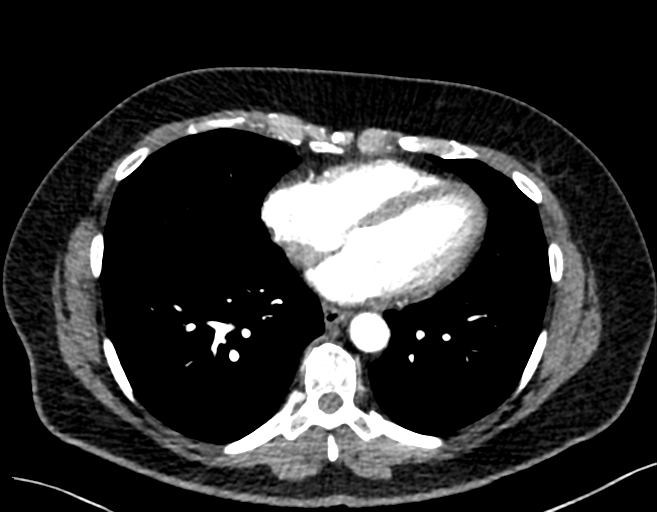
[im 90/167  lung]
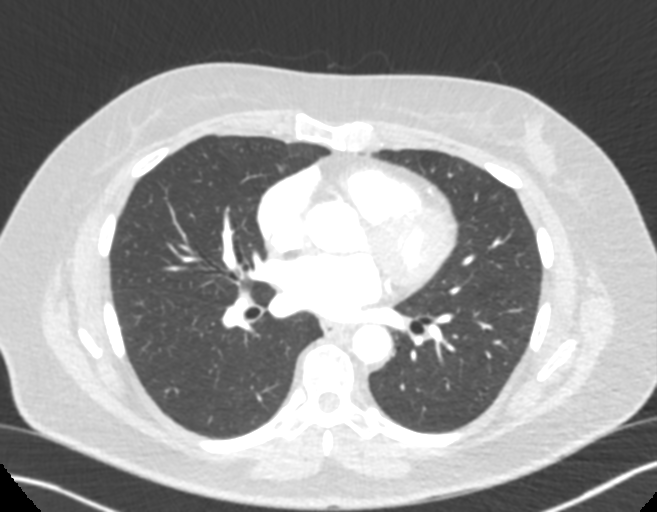
[im 103/167  mediastinal]
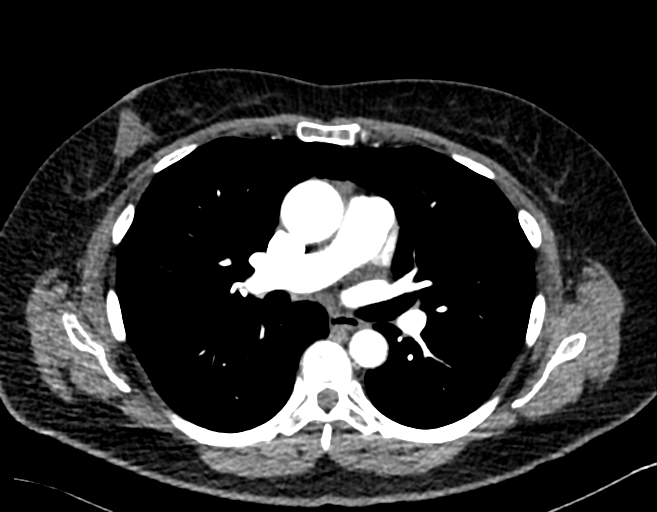
[im 115/167  lung]
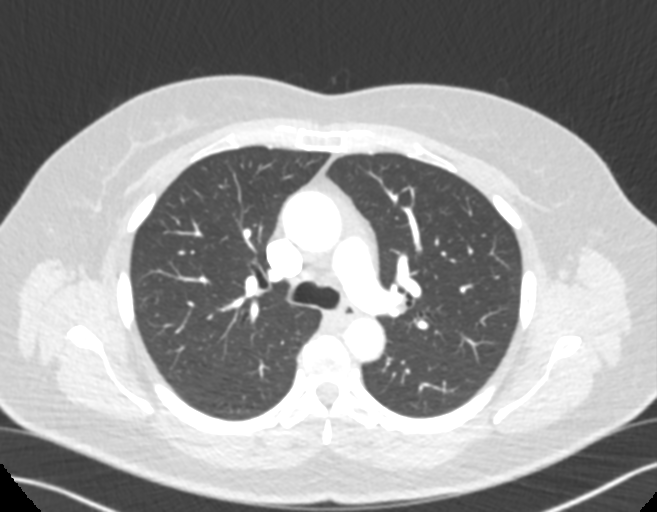
[im 128/167  mediastinal]
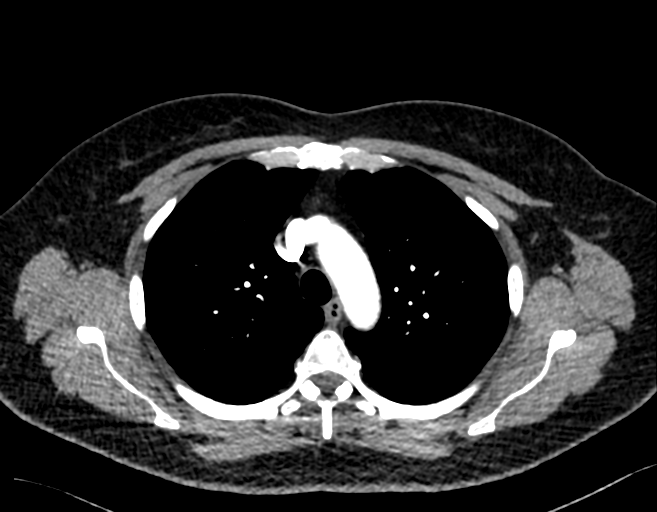
[im 141/167  lung]
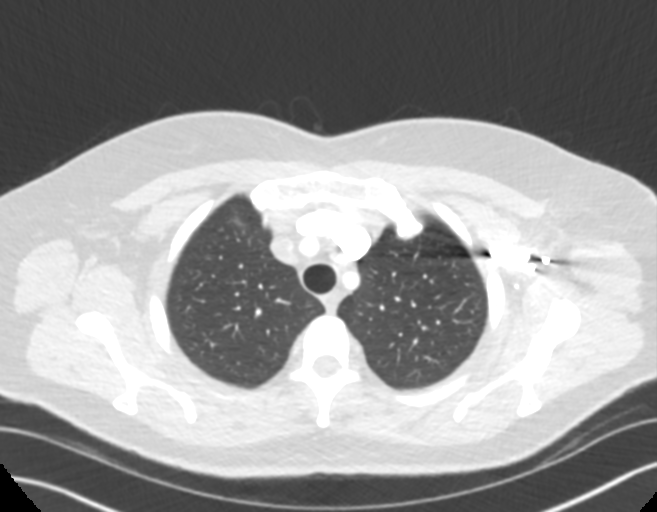
[im 154/167  mediastinal]
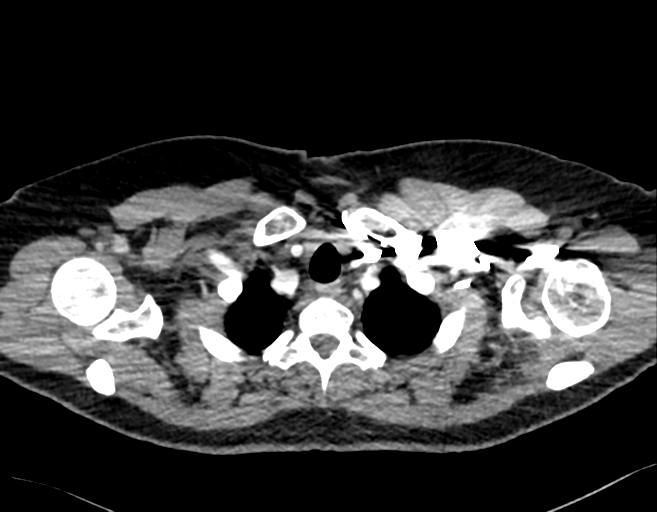

[Series 12: cta thorax 2.00 bv36 s3 cor st · coronal · 0.64mm/px · 1 of 155 slices shown]
[im 78/155  mediastinal]
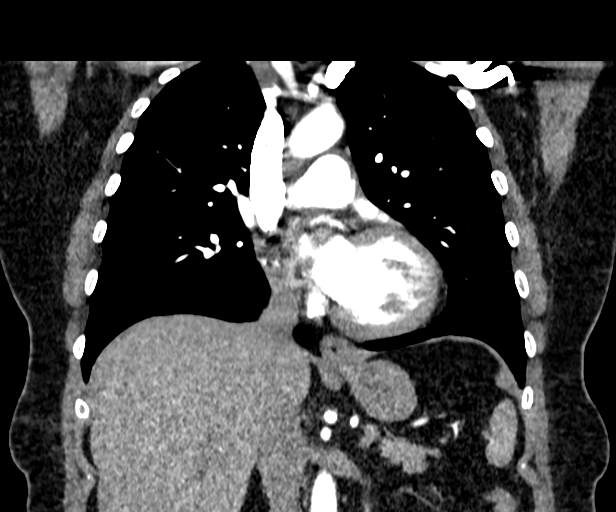

[13 of 36 positions shown; findings below may reference images not displayed]

FINDINGS: Cardiovascular: Satisfactory opacification of the pulmonary arteries
to the segmental level. No evidence of pulmonary embolism. Normal
heart size. No pericardial effusion.

Mediastinum/Nodes: No enlarged mediastinal, hilar, or axillary lymph
nodes. Thyroid gland, trachea, and esophagus demonstrate no
significant findings.

Lungs/Pleura: Background of very fine centrilobular nodules
throughout the lungs, most concentrated in the apices. No pleural
effusion or pneumothorax.

Upper Abdomen: No acute abnormality.

Musculoskeletal: No chest wall abnormality. No acute or significant
osseous findings.

Review of the MIP images confirms the above findings.
IMPRESSION: 1. Negative examination for pulmonary embolism.
2. No CT findings to explain left posterior chest pain.
3. Background of very fine centrilobular nodules throughout the
lungs, most concentrated in the apices, most commonly seen in
smoking-related respiratory bronchiolitis but generally nonspecific
and infectious or inflammatory.

## 2022-09-14 ENCOUNTER — Encounter: Payer: Self-pay | Admitting: Podiatry

## 2022-09-14 ENCOUNTER — Ambulatory Visit (INDEPENDENT_AMBULATORY_CARE_PROVIDER_SITE_OTHER): Payer: 59 | Admitting: Podiatry

## 2022-09-14 DIAGNOSIS — M722 Plantar fascial fibromatosis: Secondary | ICD-10-CM

## 2022-09-14 MED ORDER — TRIAMCINOLONE ACETONIDE 40 MG/ML IJ SUSP
20.0000 mg | Freq: Once | INTRAMUSCULAR | Status: AC
  Administered 2022-09-14: 20 mg

## 2022-09-14 NOTE — Progress Notes (Signed)
She presents today for follow-up of her Planter fasciitis of her right foot.  States that is been hurting her but hurt worse after pushing a wheelbarrow full of rock on Saturday.  Objective: Vital signs stable alert oriented x 3 she has pain on palpation medial calcaneal tubercle of the right heel.  Assessment: Planter fasciitis right.  Plan: Injected the right heel today 20 mg Kenalog 5 mg Marcaine point maximal tenderness.  Tolerated procedure well.

## 2022-10-26 ENCOUNTER — Ambulatory Visit: Payer: 59 | Admitting: Podiatry

## 2022-12-07 ENCOUNTER — Encounter: Payer: Self-pay | Admitting: Gastroenterology

## 2022-12-27 ENCOUNTER — Telehealth: Payer: Self-pay

## 2022-12-27 ENCOUNTER — Ambulatory Visit: Payer: 59

## 2022-12-27 DIAGNOSIS — Z1211 Encounter for screening for malignant neoplasm of colon: Secondary | ICD-10-CM

## 2022-12-27 NOTE — Telephone Encounter (Signed)
RN contacted patient for pre-visit for colonoscopy that was scheduled for 01/20/23. Patient has decided to postpone procedure until a more convenient time. Appointment for 01/20/23 has been cancelled.

## 2022-12-27 NOTE — Telephone Encounter (Signed)
Patient procedure has been cancelled and will be rescheduled at a future date.

## 2023-01-20 ENCOUNTER — Encounter: Payer: 59 | Admitting: Gastroenterology

## 2023-03-03 ENCOUNTER — Emergency Department (HOSPITAL_COMMUNITY)
Admission: EM | Admit: 2023-03-03 | Discharge: 2023-03-03 | Disposition: A | Discharge status: Home / Self Care | Payer: BC Managed Care – PPO

## 2023-03-03 ENCOUNTER — Emergency Department (HOSPITAL_COMMUNITY): Payer: BC Managed Care – PPO

## 2023-03-03 ENCOUNTER — Encounter (HOSPITAL_COMMUNITY): Payer: Self-pay

## 2023-03-03 ENCOUNTER — Other Ambulatory Visit: Payer: Self-pay

## 2023-03-03 DIAGNOSIS — R109 Unspecified abdominal pain: Secondary | ICD-10-CM | POA: Diagnosis present

## 2023-03-03 DIAGNOSIS — N132 Hydronephrosis with renal and ureteral calculous obstruction: Secondary | ICD-10-CM | POA: Diagnosis not present

## 2023-03-03 DIAGNOSIS — N201 Calculus of ureter: Secondary | ICD-10-CM

## 2023-03-03 LAB — CBC
HCT: 37.1 % (ref 36.0–46.0)
Hemoglobin: 12.5 g/dL (ref 12.0–15.0)
MCH: 31.6 pg (ref 26.0–34.0)
MCHC: 33.7 g/dL (ref 30.0–36.0)
MCV: 93.9 fL (ref 80.0–100.0)
Platelets: 219 10*3/uL (ref 150–400)
RBC: 3.95 MIL/uL (ref 3.87–5.11)
RDW: 12.2 % (ref 11.5–15.5)
WBC: 9.6 10*3/uL (ref 4.0–10.5)
nRBC: 0 % (ref 0.0–0.2)

## 2023-03-03 LAB — URINALYSIS, ROUTINE W REFLEX MICROSCOPIC
Bacteria, UA: NONE SEEN
Bilirubin Urine: NEGATIVE
Glucose, UA: NEGATIVE mg/dL
Ketones, ur: NEGATIVE mg/dL
Leukocytes,Ua: NEGATIVE
Nitrite: NEGATIVE
Protein, ur: NEGATIVE mg/dL
Specific Gravity, Urine: 1.023 (ref 1.005–1.030)
pH: 5 (ref 5.0–8.0)

## 2023-03-03 LAB — BASIC METABOLIC PANEL
Anion gap: 10 (ref 5–15)
BUN: 20 mg/dL (ref 8–23)
CO2: 24 mmol/L (ref 22–32)
Calcium: 9.5 mg/dL (ref 8.9–10.3)
Chloride: 105 mmol/L (ref 98–111)
Creatinine, Ser: 0.91 mg/dL (ref 0.44–1.00)
GFR, Estimated: >60 mL/min (ref >60)
Glucose, Bld: 126 mg/dL — ABNORMAL HIGH (ref 70–99)
Potassium: 3.7 mmol/L (ref 3.5–5.1)
Sodium: 139 mmol/L (ref 135–145)

## 2023-03-03 MED ORDER — MORPHINE SULFATE (PF) 4 MG/ML IV SOLN
4.0000 mg | Freq: Once | INTRAVENOUS | Status: AC
  Administered 2023-03-03: 4 mg via INTRAVENOUS
  Filled 2023-03-03: qty 1

## 2023-03-03 MED ORDER — ONDANSETRON 4 MG PO TBDP
4.0000 mg | ORAL_TABLET | Freq: Three times a day (TID) | ORAL | 0 refills | Status: AC | PRN
Start: 2023-03-03 — End: ?

## 2023-03-03 MED ORDER — OXYCODONE-ACETAMINOPHEN 5-325 MG PO TABS
1.0000 | ORAL_TABLET | Freq: Four times a day (QID) | ORAL | 0 refills | Status: AC | PRN
Start: 2023-03-03 — End: ?

## 2023-03-03 MED ORDER — KETOROLAC TROMETHAMINE 30 MG/ML IJ SOLN
30.0000 mg | Freq: Once | INTRAMUSCULAR | Status: AC
  Administered 2023-03-03: 30 mg via INTRAVENOUS
  Filled 2023-03-03: qty 1

## 2023-03-03 MED ORDER — ONDANSETRON HCL 4 MG/2ML IJ SOLN
4.0000 mg | Freq: Once | INTRAMUSCULAR | Status: AC
  Administered 2023-03-03: 4 mg via INTRAVENOUS
  Filled 2023-03-03: qty 2

## 2023-03-03 MED ORDER — TAMSULOSIN HCL 0.4 MG PO CAPS: 0.4000 mg | ORAL_CAPSULE | Freq: Every day | ORAL | 0 refills | Status: AC

## 2023-03-03 NOTE — ED Provider Notes (Signed)
[place] [facility] AT [facility] Provider Note   CSN: [id number] Arrival date & time: 03/03/23  1459     History Chief Complaint  Patient presents with   Flank Pain         Veronica Zimmerman is a 62 y.o. female.  Patient with past history significant for hysterectomy presents emergency department concerns of flank pain.  Reports the pain began approximately at 1:30 PM this afternoon.  Does endorse some nausea but no vomiting.  Did have an episode of diarrhea yesterday but this has resolved.  He is currently feeling that she is having increased urinary urgency but is able to only urinate small amounts.  Has not taken anything for pain that has helped.  Prior history of kidney stones.  No significant abdominal tenderness.  Reports that she is currently having hot flashes and is unsure if this is her typical hot flash or if she is febrile.   Flank Pain       Home Medications Prior to Admission medications   Medication Sig Start Date End Date Taking? Authorizing Provider  ondansetron (ZOFRAN-ODT) 4 MG disintegrating tablet Take 1 tablet (4 mg total) by mouth every 8 (eight) hours as needed. 03/03/23  Yes Smitty Knudsen, PA-C  oxyCODONE-acetaminophen (PERCOCET/ROXICET) 5-325 MG tablet Take 1 tablet by mouth every 6 (six) hours as needed for severe pain (pain score 7-10). 03/03/23  Yes Smitty Knudsen, PA-C  tamsulosin (FLOMAX) 0.4 MG CAPS capsule Take 1 capsule (0.4 mg total) by mouth at bedtime for 15 days. 03/03/23 03/18/23 Yes Smitty Knudsen, PA-C  fluticasone (FLONASE) 50 MCG/ACT nasal spray Place 2 sprays into both nostrils at bedtime. 09/29/16   [provider]  meloxicam (MOBIC) 15 MG tablet TAKE 1 TABLET (15 MG TOTAL) BY MOUTH DAILY. 04/04/16   Hyatt, Max T, DPM      Allergies    Patient has no known allergies.    Review of Systems   Review of Systems  Genitourinary:  Positive for flank pain.  All other systems reviewed and are  negative.   Physical Exam Updated Vital Signs BP 134/72 (BP Location: Left Arm)   Pulse 66   Temp 98.4 F (36.9 C) (Oral)   Resp 18   Ht 5\' 3"  (1.6 m)   Wt 102.1 kg   SpO2 98%   BMI 39.86 kg/m  Physical Exam Vitals and nursing note reviewed.  Constitutional:      General: She is not in acute distress.    Appearance: She is well-developed.  HENT:     Head: Normocephalic and atraumatic.  Eyes:     Conjunctiva/sclera: Conjunctivae normal.  Cardiovascular:     Rate and Rhythm: Normal rate and regular rhythm.     Heart sounds: No murmur heard. Pulmonary:     Effort: Pulmonary effort is normal. No respiratory distress.     Breath sounds: Normal breath sounds.  Abdominal:     Palpations: Abdomen is soft.     Tenderness: There is no abdominal tenderness. There is right CVA tenderness and left CVA tenderness.  Musculoskeletal:        General: No swelling.     Cervical back: Neck supple.  Skin:    General: Skin is warm and dry.     Capillary Refill: Capillary refill takes less than 2 seconds.  Neurological:     Mental Status: She is alert.  Psychiatric:        Mood and Affect: Mood normal.  ED Results / Procedures / Treatments   Labs (all labs ordered are listed, but only abnormal results are displayed) Labs Reviewed  URINALYSIS, ROUTINE W REFLEX MICROSCOPIC - Abnormal; Notable for the following components:      Result Value   Hgb urine dipstick SMALL (*)    All other components within normal limits  BASIC METABOLIC PANEL - Abnormal; Notable for the following components:   Glucose, Bld 126 (*)    All other components within normal limits  CBC    EKG None  Radiology CT Renal Stone Study  Result Date: 03/03/2023 CLINICAL DATA:  Abdominal/flank pain, stone suspected EXAM: CT ABDOMEN AND PELVIS WITHOUT CONTRAST TECHNIQUE: Multidetector CT imaging of the abdomen and pelvis was performed following the standard protocol without IV contrast. RADIATION DOSE  REDUCTION: This exam was performed according to the departmental dose-optimization program which includes automated exposure control, adjustment of the mA and/or kV according to patient size and/or use of iterative reconstruction technique. COMPARISON:  None Available. FINDINGS: Lower chest: No pleural effusion or basilar airspace disease. 5 mm peri diaphragmatic nodule in the right lower lobe series 10, image 32, is unchanged from chest CT 11/13/2020 and considered definitively benign. This needs no further imaging follow-up. Hepatobiliary: The liver is enlarged spanning 21 cm cranial caudal. Borderline hepatic steatosis. There is no evidence of focal liver abnormality. Partially distended gallbladder. No calcified gallstone. No pericholecystic inflammation. No biliary dilatation. Pancreas: No ductal dilatation or inflammation. Spleen: Normal in size without focal abnormality. Adrenals/Urinary Tract: Normal adrenal glands. Obstructing 6 x 3 mm stone at the left ureterovesicular junction with mild proximal hydroureteronephrosis. Mild left perinephric edema. There is a punctate nonobstructing stone in the mid left kidney. No right renal calculi or right hydronephrosis. There is an indeterminate exophytic lesion measuring 11 mm from the mid right kidney, Hounsfield units of 18. Urinary bladder is near completely empty. Stomach/Bowel: No bowel obstruction or inflammation. Small volume of stool in the colon. Mild fecalization of distal small bowel contents. The appendix is normal. There is a tiny hiatal hernia. Vascular/Lymphatic: Aortic atherosclerosis. No aneurysm. No enlarged lymph nodes in the abdomen or pelvis. Reproductive: Hysterectomy. The left ovary is tentatively visualized and quiescent. Right ovary is not seen. No adnexal mass. Other: Occasional pelvic phleboliths. No ascites. Diminutive fat containing umbilical hernia. Musculoskeletal: Lower lumbar facet hypertrophy. There are no acute or suspicious osseous  abnormalities. IMPRESSION: 1. Obstructing 6 x 3 mm stone at the left ureterovesicular junction with mild proximal hydroureteronephrosis. 2. Punctate nonobstructing stone in the mid left kidney. 3. Indeterminate 11 mm exophytic lesion from the mid right kidney. Recommend nonemergent renal protocol MRI for characterization. 4. Hepatomegaly and borderline hepatic steatosis. 5. Tiny hiatal hernia. Aortic Atherosclerosis (ICD10-I70.0). Electronically Signed   By: Narda Rutherford M.D.   On: 03/03/2023 17:57    Procedures Procedures   Medications Ordered in ED Medications  morphine (PF) 4 MG/ML injection 4 mg (4 mg Intravenous Given 03/03/23 1646)  ondansetron (ZOFRAN) injection 4 mg (4 mg Intravenous Given 03/03/23 1645)  morphine (PF) 4 MG/ML injection 4 mg (4 mg Intravenous Given 03/03/23 1730)  ketorolac (TORADOL) 30 MG/ML injection 30 mg (30 mg Intravenous Given 03/03/23 1814)  ondansetron (ZOFRAN) injection 4 mg (4 mg Intravenous Given 03/03/23 1826)    ED Course/ Medical Decision Making/ A&P Clinical Course as of 03/03/23 2304  Fri Mar 03, 2023  1740 Glucose, UA: NEGATIVE [OZ]    Clinical Course User Index [OZ] Smitty Knudsen, PA-C  Medical Decision Making Amount and/or Complexity of Data Reviewed Labs: ordered. Decision-making details documented in ED Course. Radiology: ordered.  Risk Prescription drug management.   This patient presents to the ED for concern of flank pain. Differential diagnosis includes urolithiasis, pyelonephritis, sepsis, muscle strain, bowel obstruction   Lab Tests:  I Ordered, and personally interpreted labs.  The pertinent results include: CBC unremarkable, BMP without evidence of renal impairment, urinalysis with hemoglobin but no signs of infection as there are no nitrites or leukocytes or bacteria seen   Imaging Studies ordered:  I ordered imaging studies including CT renal stone study I independently visualized and  interpreted imaging which showed 6x3 obstructing stone in the left UVJ. Left sided hydronephrosis seen. I agree with the radiologist interpretation   Medicines ordered and prescription drug management:  I ordered medication including morphine, Zofran for pain and nausea Reevaluation of the patient after these medicines showed that the patient improved I have reviewed the patients home medicines and have made adjustments as needed   Problem List / ED Course:  Patient presents to the emergency department concerns of flank pain.  Reports the pain began at approximately 1:30 PM this afternoon.  Has associated nausea with no vomiting.  Had diarrhea yesterday but this has resolved.  No prior history of kidney stones who feels that this is similar to some of her most severe pain that she has ever had.  Has not been able to manage symptoms with any medications at home.  Denies obvious fevers or chills but does endorse that she is having hot flashes. Exam with notable bilateral CVA tenderness with left being more concerning than right.  No abdominal tenderness.  Basic labs and CT renal stone study ordered for evaluation.  Symptomatic control with morphine and Zofran ordered. CT scan with evidence of obstructing left UVJ stone. No signs of infected stone at this time requiring antibiotics. Kidney function unremarkable. Will add on Toradol for better symptom control given findings. Patient reassessed after Toradol and reports significant reduction in pain compared to morphine. Tolerating PO. Will discharge home with expulsion therapy in place as well as pain and nausea control. Discussed strict return precautions with patient. Patient discharged home in stable condition.   Final Clinical Impression(s) / ED Diagnoses Final diagnoses:  Calculus of ureter    Rx / DC Orders ED Discharge Orders          Ordered    oxyCODONE-acetaminophen (PERCOCET/ROXICET) 5-325 MG tablet  Every 6 hours PRN         03/03/23 1846    ondansetron (ZOFRAN-ODT) 4 MG disintegrating tablet  Every 8 hours PRN        03/03/23 1846    tamsulosin (FLOMAX) 0.4 MG CAPS capsule  Daily at bedtime        03/03/23 1846              Smitty Knudsen, PA-C 03/03/23 2304    Durwin Glaze, MD 03/03/23 2316

## 2023-03-03 NOTE — ED Triage Notes (Signed)
PT presenting with right sided flank pain, and urinary frequency. Pt does endorse nausea, and 1 episode of diarrhea yesterday. Pt states she feels like she has to urinate, but when she goes only a small amount comes out. Denies any hx of kidney stones.

## 2023-03-03 NOTE — Discharge Instructions (Addendum)
You are seen in the emergency department today for flank pain.  Your CT scan shows that there is a stone on the left side between the ureter and the bladder.  This is likely explaining her pain at this time.  You do have some urine that is backed up in the left kidney there is no signs of any kidney impairment.  I would recommend taking these medications that are prescribed including Flomax, Percocet, and Zofran for management of your symptoms. If symptoms worsen and you develop fevers, vomiting that you can't control or dehydration, return to the ER.

## 2024-02-05 ENCOUNTER — Encounter: Payer: Self-pay | Admitting: Gastroenterology

## 2024-02-13 ENCOUNTER — Other Ambulatory Visit (HOSPITAL_BASED_OUTPATIENT_CLINIC_OR_DEPARTMENT_OTHER): Payer: Self-pay | Admitting: Internal Medicine

## 2024-02-13 DIAGNOSIS — E7800 Pure hypercholesterolemia, unspecified: Secondary | ICD-10-CM

## 2024-03-06 ENCOUNTER — Ambulatory Visit (HOSPITAL_BASED_OUTPATIENT_CLINIC_OR_DEPARTMENT_OTHER)
Admission: RE | Admit: 2024-03-06 | Discharge: 2024-03-06 | Disposition: A | Discharge status: Home / Self Care | Payer: Self-pay | Source: Ambulatory Visit | Attending: Internal Medicine | Admitting: Internal Medicine

## 2024-03-06 DIAGNOSIS — E7800 Pure hypercholesterolemia, unspecified: Secondary | ICD-10-CM | POA: Insufficient documentation

## 2024-03-07 ENCOUNTER — Other Ambulatory Visit: Payer: Self-pay

## 2024-03-07 ENCOUNTER — Ambulatory Visit

## 2024-03-07 VITALS — Ht 63.0 in | Wt 213.0 lb

## 2024-03-07 DIAGNOSIS — Z1211 Encounter for screening for malignant neoplasm of colon: Secondary | ICD-10-CM

## 2024-03-07 MED ORDER — NA SULFATE-K SULFATE-MG SULF 17.5-3.13-1.6 GM/177ML PO SOLN: 1.0000 | Freq: Once | ORAL | 0 refills | Status: AC

## 2024-03-07 NOTE — Progress Notes (Signed)
 Denies allergies to eggs or soy products. Denies complication of anesthesia or sedation. Denies use of weight loss medication. Denies use of O2.   Emmi instructions given for colonoscopy.

## 2024-03-19 ENCOUNTER — Encounter: Payer: Self-pay | Admitting: Gastroenterology

## 2024-03-21 ENCOUNTER — Ambulatory Visit: Admitting: Gastroenterology

## 2024-03-21 ENCOUNTER — Encounter: Payer: Self-pay | Admitting: Gastroenterology

## 2024-03-21 VITALS — BP 128/84 | HR 67 | Temp 97.0°F | Resp 11 | Ht 63.0 in | Wt 213.0 lb

## 2024-03-21 DIAGNOSIS — D125 Benign neoplasm of sigmoid colon: Secondary | ICD-10-CM

## 2024-03-21 DIAGNOSIS — D122 Benign neoplasm of ascending colon: Secondary | ICD-10-CM

## 2024-03-21 DIAGNOSIS — Z1211 Encounter for screening for malignant neoplasm of colon: Secondary | ICD-10-CM

## 2024-03-21 DIAGNOSIS — K648 Other hemorrhoids: Secondary | ICD-10-CM | POA: Diagnosis not present

## 2024-03-21 DIAGNOSIS — K644 Residual hemorrhoidal skin tags: Secondary | ICD-10-CM

## 2024-03-21 MED ORDER — SODIUM CHLORIDE 0.9 % IV SOLN: 500.0000 mL | Freq: Once | INTRAVENOUS | Status: DC

## 2024-03-21 NOTE — Op Note (Signed)
 [facility] Patient Name: Veronica Zimmerman Procedure Date: 03/21/2024 9:59 AM MRN: [id number] Endoscopist: Gustav ALONSO Mcgee , MD, [id number] Age: 63 Referring MD:  Date of Birth: Aug 17, 1960 Gender: Female Account #: [id number] Procedure:                Colonoscopy Indications:              Screening for colorectal malignant neoplasm Medicines:                Monitored Anesthesia Care Procedure:                Pre-Anesthesia Assessment:                           - Prior to the procedure, a History and Physical                            was performed, and patient medications and                            allergies were reviewed. The patient's tolerance of                            previous anesthesia was also reviewed. The risks                            and benefits of the procedure and the sedation                            options and risks were discussed with the patient.                            All questions were answered, and informed consent                            was obtained. Prior Anticoagulants: The patient has                            taken no anticoagulant or antiplatelet agents. ASA                            Grade Assessment: II - A patient with mild systemic                            disease. After reviewing the risks and benefits,                            the patient was deemed in satisfactory condition to                            undergo the procedure.                           After obtaining informed consent, the colonoscope  was passed under direct vision. Throughout the                            procedure, the patient's blood pressure, pulse, and                            oxygen saturations were monitored continuously. The                            Olympus Scope [id number] was introduced through the                            anus and advanced to the the cecum, identified by                             appendiceal orifice and ileocecal valve. The                            colonoscopy was performed without difficulty. The                            patient tolerated the procedure well. The quality                            of the bowel preparation was good. The ileocecal                            valve, appendiceal orifice, and rectum were                            photographed. Scope In: 10:21:00 AM Scope Out: 10:40:12 AM Scope Withdrawal Time: 0 hours 16 minutes 17 seconds  Total Procedure Duration: 0 hours 19 minutes 12 seconds  Findings:                 The perianal and digital rectal examinations were                            normal.                           Five sessile polyps were found in the sigmoid colon                            and ascending colon. The polyps were 5 to 12 mm in                            size. These polyps were removed with a cold snare.                            Resection and retrieval were complete.                           Non-bleeding external and internal hemorrhoids were  found during retroflexion. The hemorrhoids were                            medium-sized. Complications:            No immediate complications. Estimated Blood Loss:     Estimated blood loss was minimal. Impression:               - Five 5 to 12 mm polyps in the sigmoid colon and                            in the ascending colon, removed with a cold snare.                            Resected and retrieved.                           - Non-bleeding external and internal hemorrhoids. Recommendation:           - Patient has a contact number available for                            emergencies. The signs and symptoms of potential                            delayed complications were discussed with the                            patient. Return to normal activities tomorrow.                            Written discharge instructions were provided to the                             patient.                           - Resume previous diet.                           - Continue present medications.                           - Await pathology results.                           - Repeat colonoscopy in 3 years for surveillance                            based on pathology results. Otniel Hoe V. Sahiba Granholm, MD 03/21/2024 10:49:08 AM This report has been signed electronically.

## 2024-03-21 NOTE — Progress Notes (Signed)
 Called to room to assist during endoscopic procedure.  Patient ID and intended procedure confirmed with present staff. Received instructions for my participation in the procedure from the performing physician.

## 2024-03-21 NOTE — Progress Notes (Signed)
 Sedate, gd SR, tolerated procedure well, VSS, report to RN

## 2024-03-21 NOTE — Progress Notes (Signed)
 [facility] History and Physical   Primary Care Physician:  Veronica Ingle, MD   Reason for Procedure:  Colorectal cancer screening  Plan:    Screening colonoscopy with possible interventions as needed     HPI: Veronica Zimmerman is Zimmerman very pleasant 63 y.o. female here for screening colonoscopy. Denies any nausea, vomiting, abdominal pain, melena or bright red blood per rectum  The risks and benefits as well as alternatives of endoscopic procedure(s) have been discussed and reviewed.  The patient was provided an opportunity to ask questions and all were answered. The patient agreed with the plan and demonstrated an understanding of the instructions.   Past Medical History:  Diagnosis Date   Allergy    Arthritis    Cataract    Hyperlipidemia    Hypertension     Past Surgical History:  Procedure Laterality Date   ABDOMINAL HYSTERECTOMY     bone fragment removal     CARPAL TUNNEL RELEASE  2008   CESAREAN SECTION     1984 and 1986   LIPOMA EXCISION     SCAR REVISION     Facial after an accident   TRIGGER FINGER RELEASE  2006   WISDOM TOOTH EXTRACTION     1985    Prior to Admission medications   Medication Sig Start Date End Date Taking? Authorizing Provider  Acetaminophen  (TYLENOL  8 HOUR ARTHRITIS PAIN PO) Tylenol  Arthritis Pain   Yes [provider]  diphenhydrAMINE  (BENADRYL ) 12.5 MG/5ML liquid Take by mouth Nightly.   Yes [provider]  fluticasone (FLONASE) 50 MCG/ACT nasal spray Place 2 sprays into both nostrils at bedtime. 09/29/16  Yes [provider]  meloxicam  (MOBIC ) 15 MG tablet TAKE 1 TABLET (15 MG TOTAL) BY MOUTH DAILY. 04/04/16   Veronica Zimmerman, DPM  ondansetron  (ZOFRAN -ODT) 4 MG disintegrating tablet Take 1 tablet (4 mg total) by mouth every 8 (eight) hours as needed. Patient not taking: Reported on 03/21/2024 03/03/23   Zelaya, Veronica A, PA-C  oxyCODONE -acetaminophen  (PERCOCET/ROXICET) 5-325 MG tablet Take 1 tablet  by mouth every 6 (six) hours as needed for severe pain (pain score 7-10). Patient not taking: Reported on 03/21/2024 03/03/23   Zelaya, Veronica A, PA-C    Current Outpatient Medications  Medication Sig Dispense Refill   Acetaminophen  (TYLENOL  8 HOUR ARTHRITIS PAIN PO) Tylenol  Arthritis Pain     diphenhydrAMINE  (BENADRYL ) 12.5 MG/5ML liquid Take by mouth Nightly.     fluticasone (FLONASE) 50 MCG/ACT nasal spray Place 2 sprays into both nostrils at bedtime.  3   meloxicam  (MOBIC ) 15 MG tablet TAKE 1 TABLET (15 MG TOTAL) BY MOUTH DAILY. 30 tablet 0   ondansetron  (ZOFRAN -ODT) 4 MG disintegrating tablet Take 1 tablet (4 mg total) by mouth every 8 (eight) hours as needed. (Patient not taking: Reported on 03/21/2024) 20 tablet 0   oxyCODONE -acetaminophen  (PERCOCET/ROXICET) 5-325 MG tablet Take 1 tablet by mouth every 6 (six) hours as needed for severe pain (pain score 7-10). (Patient not taking: Reported on 03/21/2024) 15 tablet 0   Current Facility-Administered Medications  Medication Dose Route Frequency Provider Last Rate Last Admin   0.9 %  sodium chloride  infusion  500 mL Intravenous Once Veronica Zimmerman V, MD        Allergies as of 03/21/2024   (No Known Allergies)    Family History  Problem Relation Age of Onset   Colon cancer Neg Hx    Esophageal cancer Neg Hx    Rectal cancer Neg Hx  Stomach cancer Neg Hx     Social History   Socioeconomic History   Marital status: Married    Spouse name: Not on file   Number of children: Not on file   Years of education: Not on file   Highest education level: Not on file  Occupational History   Not on file  Tobacco Use   Smoking status: Never   Smokeless tobacco: Never  Substance and Sexual Activity   Alcohol use: No   Drug use: No   Sexual activity: Not on file  Other Topics Concern   Not on file  Social History Narrative   Not on file   Social Drivers of Health   Financial Resource Strain: Not on file  Food Insecurity: Not  on file  Transportation Needs: Not on file  Physical Activity: Not on file  Stress: Not on file  Social Connections: Not on file  Intimate Partner Violence: Not on file    Review of Systems:  All other review of systems negative except as mentioned in the HPI.  Physical Exam: Vital signs in last 24 hours: BP 125/73   Pulse (!) 59   Temp (!) 97 F (36.1 C) (Skin)   Ht 5' 3 (1.6 m)   Wt 213 lb (96.6 kg)   SpO2 97%   BMI 37.73 kg/m  General:   Alert, NAD Lungs:  Clear .   Heart:  Regular rate and rhythm Abdomen:  Soft, nontender and nondistended. Neuro/Psych:  Alert and cooperative. Normal mood and affect. Zimmerman and O x 3  Reviewed labs, radiology imaging, old records and pertinent past GI work up  Patient is appropriate for planned procedure(s) and anesthesia in an ambulatory setting   Veronica Zimmerman , MD [phone]

## 2024-03-21 NOTE — Patient Instructions (Signed)
 Educational handout provided to patient related to Hemorrhoids and Polyps  Resume previous diet  Continue present medications  Awaiting pathology results   YOU HAD AN ENDOSCOPIC PROCEDURE TODAY AT THE [facility]:   Refer to the procedure report that was given to you for any specific questions about what was found during the examination.  If the procedure report does not answer your questions, please call your gastroenterologist to clarify.  If you requested that your care partner not be given the details of your procedure findings, then the procedure report has been included in a sealed envelope for you to review at your convenience later.  YOU SHOULD EXPECT: Some feelings of bloating in the abdomen. Passage of more gas than usual.  Walking can help get rid of the air that was put into your GI tract during the procedure and reduce the bloating. If you had a lower endoscopy (such as a colonoscopy or flexible sigmoidoscopy) you may notice spotting of blood in your stool or on the toilet paper. If you underwent a bowel prep for your procedure, you may not have a normal bowel movement for a few days.  Please Note:  You might notice some irritation and congestion in your nose or some drainage.  This is from the oxygen used during your procedure.  There is no need for concern and it should clear up in a day or so.  SYMPTOMS TO REPORT IMMEDIATELY:  Following lower endoscopy (colonoscopy or flexible sigmoidoscopy):  Excessive amounts of blood in the stool  Significant tenderness or worsening of abdominal pains  Swelling of the abdomen that is new, acute  Fever of 100F or higher  For urgent or emergent issues, a gastroenterologist can be reached at any hour by calling [phone]. Do not use MyChart messaging for urgent concerns.    DIET:  We do recommend a small meal at first, but then you may proceed to your regular diet.  Drink plenty of fluids but you should avoid alcoholic  beverages for 24 hours.  ACTIVITY:  You should plan to take it easy for the rest of today and you should NOT DRIVE or use heavy machinery until tomorrow (because of the sedation medicines used during the test).    FOLLOW UP: Our staff will call the number listed on your records the next business day following your procedure.  We will call around 7:15- 8:00 am to check on you and address any questions or concerns that you may have regarding the information given to you following your procedure. If we do not reach you, we will leave a message.     If any biopsies were taken you will be contacted by phone or by letter within the next 1-3 weeks.  Please call us at [phone] if you have not heard about the biopsies in 3 weeks.    SIGNATURES/CONFIDENTIALITY: You and/or your care partner have signed paperwork which will be entered into your electronic medical record.  These signatures attest to the fact that that the information above on your After Visit Summary has been reviewed and is understood.  Full responsibility of the confidentiality of this discharge information lies with you and/or your care-partner.

## 2024-03-21 NOTE — Progress Notes (Signed)
Pt. states no medical or surgical changes since previsit or office visit. 

## 2024-03-22 ENCOUNTER — Telehealth: Payer: Self-pay

## 2024-03-22 NOTE — Telephone Encounter (Signed)
  Follow up Call-     03/21/2024    9:28 AM  Call back number  Post procedure Call Back phone  # [phone]  Permission to leave phone message Yes     Patient questions:  Do you have a fever, pain , or abdominal swelling? No. Pain Score  0 *  Have you tolerated food without any problems? Yes.    Have you been able to return to your normal activities? Yes.    Do you have any questions about your discharge instructions: Diet   No. Medications  No. Follow up visit  No.  Do you have questions or concerns about your Care? No.  Actions: * If pain score is 4 or above: No action needed, pain <4.

## 2024-03-25 LAB — SURGICAL PATHOLOGY

## 2024-03-26 ENCOUNTER — Ambulatory Visit: Payer: Self-pay | Admitting: Gastroenterology
